# Patient Record
Sex: Female | Born: 1980 | Race: White | Hispanic: No | Marital: Married | Smoking: Never smoker
Health system: Southern US, Community
[De-identification: ages and names within clinical notes are randomized; demographics above are authoritative.]

## PROBLEM LIST (undated history)

## (undated) DIAGNOSIS — F329 Major depressive disorder, single episode, unspecified: Secondary | ICD-10-CM

## (undated) DIAGNOSIS — K219 Gastro-esophageal reflux disease without esophagitis: Secondary | ICD-10-CM

## (undated) DIAGNOSIS — F32A Depression, unspecified: Secondary | ICD-10-CM

## (undated) HISTORY — DX: Depression, unspecified: F32.A

## (undated) HISTORY — PX: TONSILLECTOMY: SUR1361

## (undated) HISTORY — PX: DILATION AND CURETTAGE OF UTERUS: SHX78

## (undated) HISTORY — DX: Major depressive disorder, single episode, unspecified: F32.9

---

## 2002-10-01 ENCOUNTER — Emergency Department (HOSPITAL_COMMUNITY): Admission: EM | Admit: 2002-10-01 | Discharge: 2002-10-01 | Payer: Self-pay | Admitting: Emergency Medicine

## 2002-10-01 ENCOUNTER — Encounter: Payer: Self-pay | Admitting: Emergency Medicine

## 2003-05-10 ENCOUNTER — Encounter: Admission: RE | Admit: 2003-05-10 | Discharge: 2003-08-08 | Payer: Self-pay | Admitting: *Deleted

## 2003-11-17 ENCOUNTER — Other Ambulatory Visit: Admission: RE | Admit: 2003-11-17 | Discharge: 2003-11-17 | Payer: Self-pay | Admitting: Family Medicine

## 2004-12-12 ENCOUNTER — Other Ambulatory Visit: Admission: RE | Admit: 2004-12-12 | Discharge: 2004-12-12 | Payer: Self-pay | Admitting: Family Medicine

## 2006-01-08 ENCOUNTER — Other Ambulatory Visit: Admission: RE | Admit: 2006-01-08 | Discharge: 2006-01-08 | Payer: Self-pay | Admitting: Family Medicine

## 2012-05-20 ENCOUNTER — Encounter (HOSPITAL_COMMUNITY): Payer: Self-pay

## 2012-05-20 ENCOUNTER — Inpatient Hospital Stay (HOSPITAL_COMMUNITY)
Admission: AD | Admit: 2012-05-20 | Discharge: 2012-05-20 | Disposition: A | Discharge status: Home / Self Care | Payer: BC Managed Care – PPO | Source: Ambulatory Visit | Attending: Family Medicine | Admitting: Family Medicine

## 2012-05-20 DIAGNOSIS — N398 Other specified disorders of urinary system: Secondary | ICD-10-CM | POA: Insufficient documentation

## 2012-05-20 DIAGNOSIS — N368 Other specified disorders of urethra: Secondary | ICD-10-CM

## 2012-05-20 NOTE — MAU Provider Note (Signed)
  History     CSN: [id number]  Arrival date and time: 05/20/12 1348   First Provider Initiated Contact with Patient 05/20/12 1415      No chief complaint on file.  HPI Katherine Giles 31 y.o. Sent from [facility] health center for a bartholin's cyst needing drainage.   OB History    Grav Para Term Preterm Abortions TAB SAB Ect Mult Living   1    1 1           History reviewed. No pertinent past medical history.  Past Surgical History  Procedure Date  . Dilation and curettage of uterus     elective abortion  . Tonsillectomy     History reviewed. No pertinent family history.  History  Substance Use Topics  . Smoking status: Never Smoker   . Smokeless tobacco: Not on file  . Alcohol Use: No    Allergies:  Allergies  Allergen Reactions  . Atropine Nausea And Vomiting    Childhood reaction  . Demerol (Meperidine) Nausea And Vomiting    Childhood reaction  . Keflex (Cephalexin) Hives    Prescriptions prior to admission  Medication Sig Dispense Refill  . calcium carbonate (TUMS - DOSED IN MG ELEMENTAL CALCIUM) 500 MG chewable tablet Chew 1 tablet by mouth daily as needed. For heartburn      . sertraline (ZOLOFT) 50 MG tablet Take 50 mg by mouth daily.        Review of Systems  Constitutional: Negative for fever.  Gastrointestinal: Negative for nausea, vomiting, abdominal pain, diarrhea and constipation.  Genitourinary:       Swollen area on vulva   Physical Exam   Blood pressure 128/78, pulse 91, temperature 98.5 F (36.9 C), temperature source Oral, resp. rate 18, height 5\' 5"  (1.651 m), weight 118.48 kg (261 lb 3.2 oz), last menstrual period 04/19/2012.  Physical Exam  Nursing note and vitals reviewed. Constitutional: She is oriented to person, place, and time. She appears well-developed and well-nourished.  HENT:  Head: Normocephalic.  Eyes: EOM are normal.  Neck: Neck supple.  Genitourinary:       Swollen area on right near the clitoris 1 cm x 1.5 cm,  nontender to palpation, no bruising, no "pointing", no draining  Musculoskeletal: Normal range of motion.  Neurological: She is alert and oriented to person, place, and time.  Skin: Skin is warm and dry.  Psychiatric: She has a normal mood and affect.    MAU Course  Procedures  MDM Nontender - no antibiotics needed at this time.  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 Return to MAU if area worsens. Message sent to the GYN clinic to schedule an appointment in the next 2 weeks if possible for reevaluation.  Katherine Giles 05/20/2012, 2:21 PM

## 2012-05-20 NOTE — MAU Note (Signed)
Patient is in with c/o possible bartholin's cyst. She describes it as a pinto bean size cyst on left labia. She noticed the discomfort 2 days ago and states that it now hurts when she moves. No drainage from that spot.

## 2012-05-22 NOTE — MAU Provider Note (Signed)
Chart reviewed and agree with management and plan.  

## 2012-06-08 ENCOUNTER — Encounter: Payer: Self-pay | Admitting: Family Medicine

## 2012-06-08 ENCOUNTER — Ambulatory Visit (INDEPENDENT_AMBULATORY_CARE_PROVIDER_SITE_OTHER): Payer: BC Managed Care – PPO | Admitting: Family Medicine

## 2012-06-08 VITALS — BP 131/94 | HR 91 | Temp 97.3°F | Ht 65.0 in | Wt 259.4 lb

## 2012-06-08 DIAGNOSIS — L723 Sebaceous cyst: Secondary | ICD-10-CM | POA: Insufficient documentation

## 2012-06-08 DIAGNOSIS — N342 Other urethritis: Secondary | ICD-10-CM

## 2012-06-08 NOTE — Progress Notes (Signed)
  Subjective:    Patient ID: Katherine Giles, female    DOB: [birth year], 31 y.o.   MRN: [id number]  HPI  Pt. Is here for f/u.  Seen in MAU for swollen ? Skene's gland.  She reports it went down after hot tub soaking for some time.  Never drained anything.  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  She has regular cycles.  Had last pap at [facility] with insufficient cells last year.  No h/o abnl pap.  Past Medical History  Diagnosis Date  . Depression    Past Surgical History  Procedure Date  . Dilation and curettage of uterus     elective abortion  . Tonsillectomy    Current Outpatient Prescriptions on File Prior to Visit  Medication Sig Dispense Refill  . sertraline (ZOLOFT) 50 MG tablet Take 50 mg by mouth daily.      . calcium carbonate (TUMS - DOSED IN MG ELEMENTAL CALCIUM) 500 MG chewable tablet Chew 1 tablet by mouth daily as needed. For heartburn       Allergies  Allergen Reactions  . Atropine Nausea And Vomiting    Childhood reaction  . Demerol (Meperidine) Nausea And Vomiting    Childhood reaction  . Keflex (Cephalexin) Hives   Family History  Problem Relation Age of Onset  . Heart disease Father   . Cancer Paternal Aunt     cervical  . Cancer Paternal Aunt     ovarian   History   Social History  . Marital Status: Single    Spouse Name: N/A    Number of Children: N/A  . Years of Education: N/A   Occupational History  . Not on file.   Social History Main Topics  . Smoking status: Never Smoker   . Smokeless tobacco: Never Used  . Alcohol Use: 1.2 oz/week    2 Glasses of wine per week  . Drug Use: No  . Sexually Active: Yes    Birth Control/ Protection: None   Other Topics Concern  . Not on file   Social History Narrative  . No narrative on file     Review of Systems  Constitutional: Negative for fever and chills.  HENT: Negative for nosebleeds, congestion and rhinorrhea.   Eyes: Negative for visual disturbance.  Respiratory:  Negative for chest tightness and shortness of breath.   Cardiovascular: Negative for chest pain.  Gastrointestinal: Negative for nausea, vomiting, abdominal pain, diarrhea, constipation, blood in stool and abdominal distention.  Genitourinary: Negative for dysuria, frequency, vaginal bleeding and menstrual problem.  Musculoskeletal: Negative for arthralgias.  Skin: Negative for rash.  Neurological: Negative for dizziness and headaches.  Psychiatric/Behavioral: Negative for behavioral problems, sleep disturbance and dysphoric mood.  All other systems reviewed and are negative.       Objective:   Physical Exam  Vitals reviewed. Constitutional: She appears well-developed and well-nourished.  HENT:  Head: Normocephalic and atraumatic.  Neck: Neck supple.  Cardiovascular: Normal rate and regular rhythm.   Pulmonary/Chest: Effort normal.  Abdominal: Soft. There is no tenderness.  Genitourinary:    There is lesion on the right labia.          Assessment & Plan:  Likley sebaceous cyst.  Not in right area for skene's gland. F/u in spring for pap testing.

## 2012-06-08 NOTE — Assessment & Plan Note (Signed)
Just watch for now-there is no evidence of inflammation.  Discussed full treatment might require excision.

## 2012-06-08 NOTE — Patient Instructions (Addendum)
To follow up for your pap--Please see me at [facility] for [facility] at [facility] at [phone].  Epidermal Cyst An epidermal cyst is sometimes called a sebaceous cyst, epidermal inclusion cyst, or infundibular cyst. These cysts usually contain a substance that looks "pasty" or "cheesy" and may have a bad smell. This substance is a protein called keratin. Epidermal cysts are usually found on the face, neck, or trunk. They may also occur in the vaginal area or other parts of the genitalia of both men and women. Epidermal cysts are usually small, painless, slow-growing bumps or lumps that move freely under the skin. It is important not to try to pop them. This may cause an infection and lead to tenderness and swelling. CAUSES  Epidermal cysts may be caused by a deep penetrating injury to the skin or a plugged hair follicle, often associated with acne. SYMPTOMS  Epidermal cysts can become inflamed and cause:  Redness.  Tenderness.  Increased temperature of the skin over the bumps or lumps.  Grayish-white, bad smelling material that drains from the bump or lump. DIAGNOSIS  Epidermal cysts are easily diagnosed by your caregiver during an exam. Rarely, a tissue sample (biopsy) may be taken to rule out other conditions that may resemble epidermal cysts. TREATMENT   Epidermal cysts often get better and disappear on their own. They are rarely ever cancerous.  If a cyst becomes infected, it may become inflamed and tender. This may require opening and draining the cyst. Treatment with antibiotics may be necessary. When the infection is gone, the cyst may be removed with minor surgery.  Small, inflamed cysts can often be treated with antibiotics or by injecting steroid medicines.  Sometimes, epidermal cysts become large and bothersome. If this happens, surgical removal in your caregiver's office may be necessary. HOME CARE INSTRUCTIONS  Only take over-the-counter or prescription  medicines as directed by your caregiver.  Take your antibiotics as directed. Finish them even if you start to feel better. SEEK MEDICAL CARE IF:   Your cyst becomes tender, red, or swollen.  Your condition is not improving or is getting worse.  You have any other questions or concerns. MAKE SURE YOU:  Understand these instructions.  Will watch your condition.  Will get help right away if you are not doing well or get worse. Document Released: 05/11/2004 Document Revised: 09/02/2011 Document Reviewed: 12/17/2010 [facility] Patient Information 2013 [place], [place].

## 2013-04-01 ENCOUNTER — Other Ambulatory Visit (HOSPITAL_COMMUNITY)
Admission: RE | Admit: 2013-04-01 | Discharge: 2013-04-01 | Disposition: A | Discharge status: Home / Self Care | Payer: BC Managed Care – PPO | Source: Ambulatory Visit | Attending: Internal Medicine | Admitting: Internal Medicine

## 2013-04-01 DIAGNOSIS — Z01419 Encounter for gynecological examination (general) (routine) without abnormal findings: Secondary | ICD-10-CM | POA: Insufficient documentation

## 2014-04-25 ENCOUNTER — Encounter: Payer: Self-pay | Admitting: Family Medicine

## 2016-04-25 ENCOUNTER — Other Ambulatory Visit: Payer: Self-pay | Admitting: Internal Medicine

## 2016-04-25 DIAGNOSIS — N6310 Unspecified lump in the right breast, unspecified quadrant: Secondary | ICD-10-CM

## 2016-04-29 ENCOUNTER — Ambulatory Visit
Admission: RE | Admit: 2016-04-29 | Discharge: 2016-04-29 | Disposition: A | Discharge status: Home / Self Care | Payer: BC Managed Care – PPO | Source: Ambulatory Visit | Attending: Internal Medicine | Admitting: Internal Medicine

## 2016-04-29 DIAGNOSIS — N6310 Unspecified lump in the right breast, unspecified quadrant: Secondary | ICD-10-CM

## 2016-05-14 DIAGNOSIS — N946 Dysmenorrhea, unspecified: Secondary | ICD-10-CM | POA: Insufficient documentation

## 2016-05-14 DIAGNOSIS — N920 Excessive and frequent menstruation with regular cycle: Secondary | ICD-10-CM | POA: Insufficient documentation

## 2017-02-28 ENCOUNTER — Other Ambulatory Visit: Payer: Self-pay | Admitting: Internal Medicine

## 2017-02-28 DIAGNOSIS — N63 Unspecified lump in unspecified breast: Secondary | ICD-10-CM

## 2017-05-02 ENCOUNTER — Ambulatory Visit: Payer: BC Managed Care – PPO

## 2017-05-02 ENCOUNTER — Ambulatory Visit
Admission: RE | Admit: 2017-05-02 | Discharge: 2017-05-02 | Disposition: A | Discharge status: Home / Self Care | Payer: BC Managed Care – PPO | Source: Ambulatory Visit | Attending: Internal Medicine | Admitting: Internal Medicine

## 2017-05-02 DIAGNOSIS — N63 Unspecified lump in unspecified breast: Secondary | ICD-10-CM

## 2019-11-17 ENCOUNTER — Other Ambulatory Visit: Payer: Self-pay | Admitting: Internal Medicine

## 2019-11-17 DIAGNOSIS — N6489 Other specified disorders of breast: Secondary | ICD-10-CM

## 2019-11-17 DIAGNOSIS — Z1231 Encounter for screening mammogram for malignant neoplasm of breast: Secondary | ICD-10-CM

## 2019-12-03 ENCOUNTER — Ambulatory Visit
Admission: RE | Admit: 2019-12-03 | Discharge: 2019-12-03 | Disposition: A | Discharge status: Home / Self Care | Payer: BC Managed Care – PPO | Source: Ambulatory Visit | Attending: Internal Medicine | Admitting: Internal Medicine

## 2019-12-03 ENCOUNTER — Other Ambulatory Visit: Payer: Self-pay | Admitting: Internal Medicine

## 2019-12-03 ENCOUNTER — Other Ambulatory Visit: Payer: Self-pay

## 2019-12-03 DIAGNOSIS — Z1231 Encounter for screening mammogram for malignant neoplasm of breast: Secondary | ICD-10-CM

## 2019-12-03 DIAGNOSIS — N6489 Other specified disorders of breast: Secondary | ICD-10-CM

## 2019-12-03 DIAGNOSIS — R599 Enlarged lymph nodes, unspecified: Secondary | ICD-10-CM

## 2019-12-10 ENCOUNTER — Ambulatory Visit
Admission: RE | Admit: 2019-12-10 | Discharge: 2019-12-10 | Disposition: A | Discharge status: Home / Self Care | Payer: BC Managed Care – PPO | Source: Ambulatory Visit | Attending: Internal Medicine | Admitting: Internal Medicine

## 2019-12-10 ENCOUNTER — Other Ambulatory Visit: Payer: Self-pay | Admitting: Internal Medicine

## 2019-12-10 ENCOUNTER — Other Ambulatory Visit: Payer: Self-pay

## 2019-12-10 DIAGNOSIS — R599 Enlarged lymph nodes, unspecified: Secondary | ICD-10-CM

## 2019-12-14 LAB — SURGICAL PATHOLOGY

## 2019-12-24 ENCOUNTER — Ambulatory Visit: Payer: Self-pay | Admitting: Surgery

## 2019-12-24 DIAGNOSIS — R590 Localized enlarged lymph nodes: Secondary | ICD-10-CM

## 2019-12-24 NOTE — H&P (Signed)
History of Present Illness Katherine Giles. Bettey Muraoka MD; 12/24/2019 12:47 PM) The patient is a 39 year old female who presents with lymphadenopathy. Referred by Dr. Shelia Media for axillary lymphadenopathy.  This is a 39 year old female who was previously followed for some asymmetry of the right breast on mammogram who recently underwent follow-up mammogram. This showed that the area of asymmetry in the upper outer right breast is stable since 2017. However the right axilla showed some enlarged lymph nodes. Ultrasound showed bilateral enlarged axillary lymph node with a 1.2 cm cortical thickness. Subsequently she underwent biopsy which revealed reactive lymphoid hyperplasia with patchy fibrosis of the right axillary lymph node. This was felt to be discordant. She presents now to discuss excisional lymph node biopsy.  No previous breast problems. No family history of breast cancer. Patient denies any systemic symptoms of unexplained weight loss, night sweats, or easy bruising.  CLINICAL DATA: Patient initially presented with an area of palpable concern in the upper outer right breast, and was subsequently found have an area of asymmetry on 04/29/2016. This was followed with repeat imaging on 05/02/2017. This is her first follow-up exam since that study. She complains of intermittent pain in the upper outer right breast.  EXAM: DIGITAL DIAGNOSTIC BILATERAL MAMMOGRAM WITH CAD AND TOMO  ULTRASOUND BILATERAL AXILLA  COMPARISON: Previous exam(s).  ACR Breast Density Category b: There are scattered areas of fibroglandular density.  FINDINGS: The area of asymmetry in the upper outer right breast is stable and has been stable since 04/29/2016. There are no breast masses, new areas of asymmetry, areas of architectural distortion or suspicious calcifications.  In the right axilla, there is a partly seen apparent enlarged lymph node.  Mammographic images were processed with CAD.  Targeted axillary  ultrasound is performed, showing bilateral enlarged lymph nodes with thickened cortices. In the right, there is an enlarged lymph node with little hilar fat, 1.2 cm cortical thickness. There are several additional abnormal lymph nodes in each axilla with abnormally thickened cortices.  IMPRESSION: 1. Bilateral axillary adenopathy of unclear etiology. Recommend tissue sampling 1 of these lymph nodes. 2. Stable benign area of asymmetric fibroglandular tissue in the upper outer right breast.  RECOMMENDATION: 1. Ultrasound-guided core needle biopsy the largest right axillary lymph node, with samples obtained in formalin and saline. The patient was scheduled for this procedure prior to leaving the breast Center.  I have discussed the findings and recommendations with the patient. If applicable, a reminder letter will be sent to the patient regarding the next appointment.  BI-RADS CATEGORY 4: Suspicious.   Electronically Signed By: Lajean Manes M.D. On: 12/03/2019 09:53  CLINICAL DATA: 39 year old female with bilateral axillary lymphadenopathy.  EXAM: [place] AXILLARY NODE CORE BIOPSY RIGHT  COMPARISON: Previous exam(s).  PROCEDURE: I met with the patient and we discussed the procedure of ultrasound-guided biopsy, including benefits and alternatives. We discussed the high likelihood of a successful procedure. We discussed the risks of the procedure, including infection, bleeding, tissue injury, clip migration, and inadequate sampling. Informed written consent was given. The usual time-out protocol was performed immediately prior to the procedure.  Using sterile technique and 1% Lidocaine as local anesthetic, under direct ultrasound visualization, a 14 gauge spring-loaded device was used to perform biopsy of a right axillary lymph node using a lateral approach. At the conclusion of the procedure [facility] tissue marker clip was deployed into the biopsy cavity. The  sample was sent in both formalin and saline. Follow up 2 view mammogram was performed and dictated separately.  IMPRESSION: Ultrasound guided biopsy of a right axillary lymph node. No apparent complications.  Electronically Signed: By: Kristopher Oppenheim M.D. On: 12/10/2019 15:33    Problem List/Past Medical Rodman Key K. Tocara Mennen, MD; 12/24/2019 12:47 PM) Kerin Ransom LYMPHADENOPATHY (R59.0)  Past Surgical History Darden Palmer, [place]; 12/24/2019 9:52 AM) Breast Biopsy Right. Oral Surgery Tonsillectomy  Diagnostic Studies History Darden Palmer, [place]; 12/24/2019 9:52 AM) Colonoscopy never Mammogram within last year Pap Smear 1-5 years ago  Allergies Darden Palmer, RMA; 12/24/2019 9:53 AM) Demerol *ANALGESICS - OPIOID* Keflex *CEPHALOSPORINS*  Medication History Darden Palmer, RMA; 12/24/2019 9:54 AM) Sertraline HCl ('100MG'$  Tablet, Oral) Active. LORazepam (0.'5MG'$  Tablet, Oral) Active. Multivitamin (Oral) Active.  Social History Darden Palmer, [place]; 12/24/2019 9:52 AM) Alcohol use Occasional alcohol use. Caffeine use Carbonated beverages. No drug use Tobacco use Never smoker.  Family History Darden Palmer, [place]; 12/24/2019 9:52 AM) Depression Father, Mother. Diabetes Mellitus Father. Heart Disease Father. Heart disease in female family member before age 9 Hypertension Father.  Pregnancy / Birth History Darden Palmer, [place]; 12/24/2019 9:52 AM) Age at menarche 48 years. Contraceptive History Oral contraceptives. Gravida 0 Para 0 Regular periods  Other Problems Rodman Key K. Niquan Charnley, MD; 12/24/2019 12:47 PM) Anxiety Disorder Back Pain Depression Gastroesophageal Reflux Disease     Review of Systems Darden Palmer RMA; 12/24/2019 9:52 AM) General Present- Appetite Loss, Fatigue and Night Sweats. Not Present- Chills, Fever, Weight Gain and Weight Loss. Skin Not Present- Change in Wart/Mole, Dryness, Hives, Jaundice, New Lesions, Non-Healing Wounds, Rash and  Ulcer. HEENT Present- Seasonal Allergies. Not Present- Earache, Hearing Loss, Hoarseness, Nose Bleed, Oral Ulcers, Ringing in the Ears, Sinus Pain, Sore Throat, Visual Disturbances, Wears glasses/contact lenses and Yellow Eyes. Respiratory Present- Snoring. Not Present- Bloody sputum, Chronic Cough, Difficulty Breathing and Wheezing. Breast Not Present- Breast Mass, Breast Pain, Nipple Discharge and Skin Changes. Cardiovascular Not Present- Chest Pain, Difficulty Breathing Lying Down, Leg Cramps, Palpitations, Rapid Heart Rate, Shortness of Breath and Swelling of Extremities. Gastrointestinal Present- Excessive gas and Indigestion. Not Present- Abdominal Pain, Bloating, Bloody Stool, Change in Bowel Habits, Chronic diarrhea, Constipation, Difficulty Swallowing, Gets full quickly at meals, Hemorrhoids, Nausea, Rectal Pain and Vomiting. Female Genitourinary Not Present- Frequency, Nocturia, Painful Urination, Pelvic Pain and Urgency. Musculoskeletal Present- Back Pain. Not Present- Joint Pain, Joint Stiffness, Muscle Pain, Muscle Weakness and Swelling of Extremities. Neurological Not Present- Decreased Memory, Fainting, Headaches, Numbness, Seizures, Tingling, Tremor, Trouble walking and Weakness. Psychiatric Present- Anxiety. Not Present- Bipolar, Change in Sleep Pattern, Depression, Fearful and Frequent crying. Endocrine Not Present- Cold Intolerance, Excessive Hunger, Hair Changes, Heat Intolerance, Hot flashes and New Diabetes. Hematology Not Present- Blood Thinners, Easy Bruising, Excessive bleeding, Gland problems, HIV and Persistent Infections.  Vitals Lattie Haw [place] RMA; 12/24/2019 9:55 AM) 12/24/2019 9:55 AM Weight: 251.38 lb Height: 64in Body Surface Area: 2.16 m Body Mass Index: 43.15 kg/m  Temp.: 43F  Pulse: 77 (Regular)  BP: 130/84(Sitting, Left Arm, Standard)        Physical Exam Rodman Key K. Eudelia Hiltunen MD; 12/24/2019 12:47 PM)  The physical exam findings are as  follows: Note:Constitutional: WDWN in NAD, conversant, no obvious deformities; resting comfortably Eyes: Pupils equal, round; sclera anicteric; moist conjunctiva; no lid lag HENT: Oral mucosa moist; good dentition Neck: No masses palpated, trachea midline; no thyromegaly Breasts: symmetric; no dominant masses; no nipple changes; No palpable lymphadenopathy on either side Lungs: CTA bilaterally; normal respiratory effort CV: Regular rate and rhythm; no murmurs; extremities well-perfused with no edema Abd: +bowel sounds, soft, non-tender, no palpable organomegaly; no palpable  hernias Musc: Normal gait; no apparent clubbing or cyanosis in extremities Lymphatic: No palpable cervical or axillary lymphadenopathy Skin: Warm, dry; no sign of jaundice Psychiatric - alert and oriented x 4; calm mood and affect    Assessment & Plan Rodman Key K. Gwendoline Judy MD; 12/24/2019 12:48 PM)  AXILLARY LYMPHADENOPATHY (R59.0) Impression: Bilateral  Current Plans Schedule for Surgery - Right targeted axillary lymph node biopsy. The surgical procedure has been discussed with the patient. Potential risks, benefits, alternative treatments, and expected outcomes have been explained. All of the patient's questions at this time have been answered. The likelihood of reaching the patient's treatment goal is good. The patient understand the proposed surgical procedure and wishes to proceed. Note:Recommend targeted right axillary lymph node biopsy to establish diagnosis. The patient has a lot of questions but most of these cannot be inserted until the lymph node biopsy is performed. She is in agreement with the plan for lymph node biopsy with complete excision of the lymph node using radioactive seed guidance.  Katherine Giles. Georgette Dover, MD, [facility]  General/ Trauma Surgery   12/24/2019 12:49 PM

## 2020-01-07 ENCOUNTER — Other Ambulatory Visit: Payer: Self-pay | Admitting: Surgery

## 2020-01-07 DIAGNOSIS — R590 Localized enlarged lymph nodes: Secondary | ICD-10-CM

## 2020-01-10 ENCOUNTER — Ambulatory Visit: Payer: Self-pay | Admitting: Surgery

## 2020-01-10 DIAGNOSIS — R590 Localized enlarged lymph nodes: Secondary | ICD-10-CM

## 2020-01-31 ENCOUNTER — Other Ambulatory Visit: Payer: Self-pay

## 2020-01-31 ENCOUNTER — Encounter (HOSPITAL_BASED_OUTPATIENT_CLINIC_OR_DEPARTMENT_OTHER): Payer: Self-pay | Admitting: Surgery

## 2020-02-05 ENCOUNTER — Other Ambulatory Visit (HOSPITAL_COMMUNITY): Payer: BC Managed Care – PPO

## 2020-02-07 ENCOUNTER — Other Ambulatory Visit (HOSPITAL_COMMUNITY)
Admission: RE | Admit: 2020-02-07 | Discharge: 2020-02-07 | Disposition: A | Discharge status: Home / Self Care | Payer: BC Managed Care – PPO | Source: Ambulatory Visit | Attending: Surgery | Admitting: Surgery

## 2020-02-07 DIAGNOSIS — Z20822 Contact with and (suspected) exposure to covid-19: Secondary | ICD-10-CM | POA: Diagnosis not present

## 2020-02-07 DIAGNOSIS — Z01812 Encounter for preprocedural laboratory examination: Secondary | ICD-10-CM | POA: Insufficient documentation

## 2020-02-07 LAB — SARS CORONAVIRUS 2 (TAT 6-24 HRS): SARS Coronavirus 2: NEGATIVE

## 2020-02-08 ENCOUNTER — Ambulatory Visit
Admission: RE | Admit: 2020-02-08 | Discharge: 2020-02-08 | Disposition: A | Discharge status: Home / Self Care | Payer: BC Managed Care – PPO | Source: Ambulatory Visit | Attending: Surgery | Admitting: Surgery

## 2020-02-08 ENCOUNTER — Other Ambulatory Visit: Payer: Self-pay | Admitting: Surgery

## 2020-02-08 ENCOUNTER — Other Ambulatory Visit: Payer: Self-pay

## 2020-02-08 DIAGNOSIS — R590 Localized enlarged lymph nodes: Secondary | ICD-10-CM

## 2020-02-08 NOTE — Progress Notes (Signed)

## 2020-02-09 ENCOUNTER — Encounter (HOSPITAL_BASED_OUTPATIENT_CLINIC_OR_DEPARTMENT_OTHER)
Admission: RE | Disposition: A | Discharge status: Home / Self Care | Payer: Self-pay | Source: Home / Self Care | Attending: Surgery

## 2020-02-09 ENCOUNTER — Ambulatory Visit (HOSPITAL_BASED_OUTPATIENT_CLINIC_OR_DEPARTMENT_OTHER)
Admission: RE | Admit: 2020-02-09 | Discharge: 2020-02-09 | Disposition: A | Discharge status: Home / Self Care | Payer: BC Managed Care – PPO | Attending: Surgery | Admitting: Surgery

## 2020-02-09 ENCOUNTER — Ambulatory Visit (HOSPITAL_BASED_OUTPATIENT_CLINIC_OR_DEPARTMENT_OTHER): Payer: BC Managed Care – PPO | Admitting: Anesthesiology

## 2020-02-09 ENCOUNTER — Encounter (HOSPITAL_BASED_OUTPATIENT_CLINIC_OR_DEPARTMENT_OTHER): Payer: Self-pay | Admitting: Surgery

## 2020-02-09 ENCOUNTER — Other Ambulatory Visit: Payer: Self-pay

## 2020-02-09 ENCOUNTER — Ambulatory Visit
Admission: RE | Admit: 2020-02-09 | Discharge: 2020-02-09 | Disposition: A | Discharge status: Home / Self Care | Payer: BC Managed Care – PPO | Source: Ambulatory Visit | Attending: Surgery | Admitting: Surgery

## 2020-02-09 DIAGNOSIS — Z6841 Body Mass Index (BMI) 40.0 and over, adult: Secondary | ICD-10-CM | POA: Diagnosis not present

## 2020-02-09 DIAGNOSIS — R590 Localized enlarged lymph nodes: Secondary | ICD-10-CM | POA: Diagnosis present

## 2020-02-09 DIAGNOSIS — Z79899 Other long term (current) drug therapy: Secondary | ICD-10-CM | POA: Diagnosis not present

## 2020-02-09 DIAGNOSIS — F329 Major depressive disorder, single episode, unspecified: Secondary | ICD-10-CM | POA: Diagnosis not present

## 2020-02-09 DIAGNOSIS — F419 Anxiety disorder, unspecified: Secondary | ICD-10-CM | POA: Insufficient documentation

## 2020-02-09 HISTORY — PX: AXILLARY LYMPH NODE BIOPSY: SHX5737

## 2020-02-09 HISTORY — DX: Gastro-esophageal reflux disease without esophagitis: K21.9

## 2020-02-09 LAB — POCT PREGNANCY, URINE: Preg Test, Ur: NEGATIVE

## 2020-02-09 SURGERY — AXILLARY LYMPH NODE BIOPSY
Anesthesia: Regional | Site: Breast | Laterality: Right

## 2020-02-09 MED ORDER — CHLORHEXIDINE GLUCONATE CLOTH 2 % EX PADS: 6.0000 | MEDICATED_PAD | Freq: Once | CUTANEOUS | Status: DC

## 2020-02-09 MED ORDER — LACTATED RINGERS IV SOLN: INTRAVENOUS | Status: DC

## 2020-02-09 MED ORDER — PROPOFOL 10 MG/ML IV BOLUS
INTRAVENOUS | Status: DC | PRN
  Administered 2020-02-09: 200 mg via INTRAVENOUS
  Administered 2020-02-09: 50 mg via INTRAVENOUS

## 2020-02-09 MED ORDER — MIDAZOLAM HCL 2 MG/2ML IJ SOLN
2.0000 mg | Freq: Once | INTRAMUSCULAR | Status: AC
  Administered 2020-02-09: 2 mg via INTRAVENOUS

## 2020-02-09 MED ORDER — HYDROCODONE-ACETAMINOPHEN 5-325 MG PO TABS
1.0000 | ORAL_TABLET | Freq: Four times a day (QID) | ORAL | 0 refills | Status: DC | PRN
Start: 2020-02-09 — End: 2020-04-03

## 2020-02-09 MED ORDER — BUPIVACAINE LIPOSOME 1.3 % IJ SUSP
INTRAMUSCULAR | Status: DC | PRN
  Administered 2020-02-09: 10 mL

## 2020-02-09 MED ORDER — PROPOFOL 10 MG/ML IV BOLUS
INTRAVENOUS | Status: AC
  Filled 2020-02-09: qty 20

## 2020-02-09 MED ORDER — OXYCODONE HCL 5 MG/5ML PO SOLN: 5.0000 mg | Freq: Once | ORAL | Status: AC | PRN

## 2020-02-09 MED ORDER — OXYCODONE HCL 5 MG PO TABS
ORAL_TABLET | ORAL | Status: AC
  Filled 2020-02-09: qty 1

## 2020-02-09 MED ORDER — FENTANYL CITRATE (PF) 100 MCG/2ML IJ SOLN
INTRAMUSCULAR | Status: AC
  Filled 2020-02-09: qty 2

## 2020-02-09 MED ORDER — FENTANYL CITRATE (PF) 100 MCG/2ML IJ SOLN
100.0000 ug | Freq: Once | INTRAMUSCULAR | Status: AC
  Administered 2020-02-09: 100 ug via INTRAVENOUS

## 2020-02-09 MED ORDER — ACETAMINOPHEN 500 MG PO TABS
1000.0000 mg | ORAL_TABLET | ORAL | Status: AC
  Administered 2020-02-09: 1000 mg via ORAL

## 2020-02-09 MED ORDER — HYDROMORPHONE HCL 1 MG/ML IJ SOLN: 0.2500 mg | INTRAMUSCULAR | Status: DC | PRN

## 2020-02-09 MED ORDER — GABAPENTIN 300 MG PO CAPS
ORAL_CAPSULE | ORAL | Status: AC
  Filled 2020-02-09: qty 1

## 2020-02-09 MED ORDER — BUPIVACAINE HCL 0.25 % IJ SOLN
INTRAMUSCULAR | Status: DC | PRN
  Administered 2020-02-09: 10 mL

## 2020-02-09 MED ORDER — OXYCODONE HCL 5 MG PO TABS
5.0000 mg | ORAL_TABLET | Freq: Once | ORAL | Status: AC | PRN
  Administered 2020-02-09: 5 mg via ORAL

## 2020-02-09 MED ORDER — ACETAMINOPHEN 500 MG PO TABS
ORAL_TABLET | ORAL | Status: AC
  Filled 2020-02-09: qty 2

## 2020-02-09 MED ORDER — PROMETHAZINE HCL 25 MG/ML IJ SOLN: 6.2500 mg | INTRAMUSCULAR | Status: DC | PRN

## 2020-02-09 MED ORDER — VANCOMYCIN HCL IN DEXTROSE 1-5 GM/200ML-% IV SOLN
INTRAVENOUS | Status: AC
  Filled 2020-02-09: qty 200

## 2020-02-09 MED ORDER — MIDAZOLAM HCL 2 MG/2ML IJ SOLN
INTRAMUSCULAR | Status: AC
  Filled 2020-02-09: qty 2

## 2020-02-09 MED ORDER — BUPIVACAINE HCL 0.5 % IJ SOLN
INTRAMUSCULAR | Status: DC | PRN
  Administered 2020-02-09: 20 mL

## 2020-02-09 MED ORDER — DEXAMETHASONE SODIUM PHOSPHATE 10 MG/ML IJ SOLN
INTRAMUSCULAR | Status: DC | PRN
  Administered 2020-02-09: 10 mg via INTRAVENOUS

## 2020-02-09 MED ORDER — MIDAZOLAM HCL 5 MG/5ML IJ SOLN
INTRAMUSCULAR | Status: DC | PRN
  Administered 2020-02-09 (×2): 1 mg via INTRAVENOUS

## 2020-02-09 MED ORDER — ONDANSETRON HCL 4 MG/2ML IJ SOLN
INTRAMUSCULAR | Status: AC
  Filled 2020-02-09: qty 2

## 2020-02-09 MED ORDER — VANCOMYCIN HCL IN DEXTROSE 1-5 GM/200ML-% IV SOLN
1000.0000 mg | INTRAVENOUS | Status: AC
  Administered 2020-02-09: 1000 mg via INTRAVENOUS

## 2020-02-09 MED ORDER — GABAPENTIN 300 MG PO CAPS
300.0000 mg | ORAL_CAPSULE | ORAL | Status: AC
  Administered 2020-02-09: 300 mg via ORAL

## 2020-02-09 MED ORDER — LIDOCAINE HCL (CARDIAC) PF 100 MG/5ML IV SOSY
PREFILLED_SYRINGE | INTRAVENOUS | Status: DC | PRN
Start: 2020-02-09 — End: 2020-02-09
  Administered 2020-02-09: 100 mg via INTRATRACHEAL

## 2020-02-09 MED ORDER — ONDANSETRON HCL 4 MG/2ML IJ SOLN
INTRAMUSCULAR | Status: DC | PRN
  Administered 2020-02-09: 4 mg via INTRAVENOUS

## 2020-02-09 MED ORDER — FENTANYL CITRATE (PF) 100 MCG/2ML IJ SOLN
INTRAMUSCULAR | Status: DC | PRN
  Administered 2020-02-09: 50 ug via INTRAVENOUS
  Administered 2020-02-09 (×2): 25 ug via INTRAVENOUS

## 2020-02-09 SURGICAL SUPPLY — 42 items
APL PRP STRL LF DISP 70% ISPRP (MISCELLANEOUS) ×1
APL SKNCLS STERI-STRIP NONHPOA (GAUZE/BANDAGES/DRESSINGS) ×1
APPLIER CLIP 11 MED OPEN (CLIP) ×2
APR CLP MED 11 20 MLT OPN (CLIP) ×1
BENZOIN TINCTURE PRP APPL 2/3 (GAUZE/BANDAGES/DRESSINGS) ×2 IMPLANT
BLADE SURG 15 STRL LF DISP TIS (BLADE) ×1 IMPLANT
BLADE SURG 15 STRL SS (BLADE) ×2
CANISTER SUCT 1200ML W/VALVE (MISCELLANEOUS) ×2 IMPLANT
CHLORAPREP W/TINT 26 (MISCELLANEOUS) ×2 IMPLANT
CLIP APPLIE 11 MED OPEN (CLIP) ×1 IMPLANT
COVER BACK TABLE 60X90IN (DRAPES) ×2 IMPLANT
COVER MAYO STAND STRL (DRAPES) ×2 IMPLANT
DRAPE LAPAROTOMY TRNSV 102X78 (DRAPES) ×2 IMPLANT
DRAPE UTILITY XL STRL (DRAPES) ×2 IMPLANT
DRSG TEGADERM 4X4.75 (GAUZE/BANDAGES/DRESSINGS) ×2 IMPLANT
ELECT REM PT RETURN 9FT ADLT (ELECTROSURGICAL) ×2
ELECTRODE REM PT RTRN 9FT ADLT (ELECTROSURGICAL) ×1 IMPLANT
GAUZE SPONGE 4X4 12PLY STRL LF (GAUZE/BANDAGES/DRESSINGS) ×2 IMPLANT
GLOVE BIO SURGEON STRL SZ 6.5 (GLOVE) ×2 IMPLANT
GLOVE BIO SURGEON STRL SZ7 (GLOVE) ×2 IMPLANT
GLOVE BIOGEL PI IND STRL 6.5 (GLOVE) ×1 IMPLANT
GLOVE BIOGEL PI IND STRL 7.5 (GLOVE) ×1 IMPLANT
GLOVE BIOGEL PI INDICATOR 6.5 (GLOVE) ×1
GLOVE BIOGEL PI INDICATOR 7.5 (GLOVE) ×1
GOWN STRL REUS W/ TWL LRG LVL3 (GOWN DISPOSABLE) ×2 IMPLANT
GOWN STRL REUS W/TWL LRG LVL3 (GOWN DISPOSABLE) ×4
NEEDLE HYPO 25X1 1.5 SAFETY (NEEDLE) ×2 IMPLANT
NS IRRIG 1000ML POUR BTL (IV SOLUTION) ×2 IMPLANT
PACK BASIN DAY SURGERY FS (CUSTOM PROCEDURE TRAY) ×2 IMPLANT
PENCIL SMOKE EVACUATOR (MISCELLANEOUS) ×2 IMPLANT
SLEEVE SCD COMPRESS KNEE MED (MISCELLANEOUS) ×2 IMPLANT
SPONGE LAP 4X18 RFD (DISPOSABLE) ×2 IMPLANT
STRIP CLOSURE SKIN 1/2X4 (GAUZE/BANDAGES/DRESSINGS) ×2 IMPLANT
SUT MON AB 4-0 PC3 18 (SUTURE) ×2 IMPLANT
SUT VIC AB 2-0 SH 27 (SUTURE) ×4
SUT VIC AB 2-0 SH 27XBRD (SUTURE) ×2 IMPLANT
SUT VIC AB 3-0 SH 27 (SUTURE) ×2
SUT VIC AB 3-0 SH 27X BRD (SUTURE) ×1 IMPLANT
SYR CONTROL 10ML LL (SYRINGE) ×2 IMPLANT
TOWEL GREEN STERILE FF (TOWEL DISPOSABLE) ×2 IMPLANT
TUBE CONNECTING 20X1/4 (TUBING) ×2 IMPLANT
YANKAUER SUCT BULB TIP NO VENT (SUCTIONS) ×2 IMPLANT

## 2020-02-09 NOTE — H&P (Signed)
History of Present Illness  The patient is a 39 year old female who presents with lymphadenopathy. Referred by Dr. Shelia Media for axillary lymphadenopathy.  This is a 39 year old female who was previously followed for some asymmetry of the right breast on mammogram who recently underwent follow-up mammogram. This showed that the area of asymmetry in the upper outer right breast is stable since 2017. However the right axilla showed some enlarged lymph nodes. Ultrasound showed bilateral enlarged axillary lymph node with a 1.2 cm cortical thickness. Subsequently she underwent biopsy which revealed reactive lymphoid hyperplasia with patchy fibrosis of the right axillary lymph node. This was felt to be discordant. She presents now to discuss excisional lymph node biopsy.  No previous breast problems. No family history of breast cancer. Patient denies any systemic symptoms of unexplained weight loss, night sweats, or easy bruising.  CLINICAL DATA: Patient initially presented with an area of palpable concern in the upper outer right breast, and was subsequently found have an area of asymmetry on 04/29/2016. This was followed with repeat imaging on 05/02/2017. This is her first follow-up exam since that study. She complains of intermittent pain in the upper outer right breast.  EXAM: DIGITAL DIAGNOSTIC BILATERAL MAMMOGRAM WITH CAD AND TOMO  ULTRASOUND BILATERAL AXILLA  COMPARISON: Previous exam(s).  ACR Breast Density Category b: There are scattered areas of fibroglandular density.  FINDINGS: The area of asymmetry in the upper outer right breast is stable and has been stable since 04/29/2016. There are no breast masses, new areas of asymmetry, areas of architectural distortion or suspicious calcifications.  In the right axilla, there is a partly seen apparent enlarged lymph node.  Mammographic images were processed with CAD.  Targeted axillary ultrasound is performed,  showing bilateral enlarged lymph nodes with thickened cortices. In the right, there is an enlarged lymph node with little hilar fat, 1.2 cm cortical thickness. There are several additional abnormal lymph nodes in each axilla with abnormally thickened cortices.  IMPRESSION: 1. Bilateral axillary adenopathy of unclear etiology. Recommend tissue sampling 1 of these lymph nodes. 2. Stable benign area of asymmetric fibroglandular tissue in the upper outer right breast.  RECOMMENDATION: 1. Ultrasound-guided core needle biopsy the largest right axillary lymph node, with samples obtained in formalin and saline. The patient was scheduled for this procedure prior to leaving the breast Center.  I have discussed the findings and recommendations with the patient. If applicable, a reminder letter will be sent to the patient regarding the next appointment.  BI-RADS CATEGORY 4: Suspicious.   Electronically Signed By: Lajean Manes M.D. On: 12/03/2019 09:53  CLINICAL DATA: 39 year old female with bilateral axillary lymphadenopathy.  EXAM: [place] AXILLARY NODE CORE BIOPSY RIGHT  COMPARISON: Previous exam(s).  PROCEDURE: I met with the patient and we discussed the procedure of ultrasound-guided biopsy, including benefits and alternatives. We discussed the high likelihood of a successful procedure. We discussed the risks of the procedure, including infection, bleeding, tissue injury, clip migration, and inadequate sampling. Informed written consent was given. The usual time-out protocol was performed immediately prior to the procedure.  Using sterile technique and 1% Lidocaine as local anesthetic, under direct ultrasound visualization, a 14 gauge spring-loaded device was used to perform biopsy of a right axillary lymph node using a lateral approach. At the conclusion of the procedure [facility] tissue marker clip was deployed into the biopsy cavity. The sample was sent in  both formalin and saline. Follow up 2 view mammogram was performed and dictated separately.  IMPRESSION: Ultrasound guided biopsy of  a right axillary lymph node. No apparent complications.  Electronically Signed: By: Kristopher Oppenheim M.D. On: 12/10/2019 15:33    Problem List/Past Medical  AXILLARY LYMPHADENOPATHY (R59.0)  Past Surgical History  Breast Biopsy Right. Oral Surgery Tonsillectomy  Diagnostic Studies History  Colonoscopy never Mammogram within last year Pap Smear 1-5 years ago  Allergies  Demerol *ANALGESICS - OPIOID* Keflex *CEPHALOSPORINS*  Medication History  Sertraline HCl (100MG Tablet, Oral) Active. LORazepam (0.5MG Tablet, Oral) Active. Multivitamin (Oral) Active.  Social History Alcohol use Occasional alcohol use. Caffeine use Carbonated beverages. No drug use Tobacco use Never smoker.  Family History  Depression Father, Mother. Diabetes Mellitus Father. Heart Disease Father. Heart disease in female family member before age 37 Hypertension Father.  Pregnancy / Birth History  Age at menarche 13 years. Contraceptive History Oral contraceptives. Gravida 0 Para 0 Regular periods  Other Problems  Anxiety Disorder Back Pain Depression Gastroesophageal Reflux Disease     Review of Systems  General Present- Appetite Loss, Fatigue and Night Sweats. Not Present- Chills, Fever, Weight Gain and Weight Loss. Skin Not Present- Change in Wart/Mole, Dryness, Hives, Jaundice, New Lesions, Non-Healing Wounds, Rash and Ulcer. HEENT Present- Seasonal Allergies. Not Present- Earache, Hearing Loss, Hoarseness, Nose Bleed, Oral Ulcers, Ringing in the Ears, Sinus Pain, Sore Throat, Visual Disturbances, Wears glasses/contact lenses and Yellow Eyes. Respiratory Present- Snoring. Not Present- Bloody sputum, Chronic Cough, Difficulty Breathing and Wheezing. Breast Not Present- Breast Mass, Breast Pain, Nipple  Discharge and Skin Changes. Cardiovascular Not Present- Chest Pain, Difficulty Breathing Lying Down, Leg Cramps, Palpitations, Rapid Heart Rate, Shortness of Breath and Swelling of Extremities. Gastrointestinal Present- Excessive gas and Indigestion. Not Present- Abdominal Pain, Bloating, Bloody Stool, Change in Bowel Habits, Chronic diarrhea, Constipation, Difficulty Swallowing, Gets full quickly at meals, Hemorrhoids, Nausea, Rectal Pain and Vomiting. Female Genitourinary Not Present- Frequency, Nocturia, Painful Urination, Pelvic Pain and Urgency. Musculoskeletal Present- Back Pain. Not Present- Joint Pain, Joint Stiffness, Muscle Pain, Muscle Weakness and Swelling of Extremities. Neurological Not Present- Decreased Memory, Fainting, Headaches, Numbness, Seizures, Tingling, Tremor, Trouble walking and Weakness. Psychiatric Present- Anxiety. Not Present- Bipolar, Change in Sleep Pattern, Depression, Fearful and Frequent crying. Endocrine Not Present- Cold Intolerance, Excessive Hunger, Hair Changes, Heat Intolerance, Hot flashes and New Diabetes. Hematology Not Present- Blood Thinners, Easy Bruising, Excessive bleeding, Gland problems, HIV and Persistent Infections.  Vitals  Weight: 251.38 lb Height: 64in Body Surface Area: 2.16 m Body Mass Index: 43.15 kg/m  Temp.: 83F  Pulse: 77 (Regular)  BP: 130/84(Sitting, Left Arm, Standard)        Physical Exam   The physical exam findings are as follows: Note:Constitutional: WDWN in NAD, conversant, no obvious deformities; resting comfortably Eyes: Pupils equal, round; sclera anicteric; moist conjunctiva; no lid lag HENT: Oral mucosa moist; good dentition Neck: No masses palpated, trachea midline; no thyromegaly Breasts: symmetric; no dominant masses; no nipple changes; No palpable lymphadenopathy on either side Lungs: CTA bilaterally; normal respiratory effort CV: Regular rate and rhythm; no murmurs; extremities  well-perfused with no edema Abd: +bowel sounds, soft, non-tender, no palpable organomegaly; no palpable hernias Musc: Normal gait; no apparent clubbing or cyanosis in extremities Lymphatic: No palpable cervical or axillary lymphadenopathy Skin: Warm, dry; no sign of jaundice Psychiatric - alert and oriented x 4; calm mood and affect    Assessment & Plan   AXILLARY LYMPHADENOPATHY (R59.0) Impression: Bilateral  Current Plans Schedule for Surgery - Right targeted axillary lymph node biopsy. The surgical procedure has been discussed with the patient. Potential  risks, benefits, alternative treatments, and expected outcomes have been explained. All of the patient's questions at this time have been answered. The likelihood of reaching the patient's treatment goal is good. The patient understand the proposed surgical procedure and wishes to proceed. Note:Recommend targeted right axillary lymph node biopsy to establish diagnosis. The patient has a lot of questions but most of these cannot be inserted until the lymph node biopsy is performed. She is in agreement with the plan for lymph node biopsy with complete excision of the lymph node using radioactive seed guidance.   Imogene Burn. Georgette Dover, MD, [facility]  General/ Trauma Surgery   02/09/2020 8:23 AM

## 2020-02-09 NOTE — Anesthesia Postprocedure Evaluation (Signed)
Anesthesia Post Note  Patient: Katherine Giles  Procedure(s) Performed: RIGHT TARGETED AXILLARY LYMPH NODE BIOPSY (Right Breast)     Patient location during evaluation: PACU Anesthesia Type: Regional Level of consciousness: awake and alert Pain management: pain level controlled Vital Signs Assessment: post-procedure vital signs reviewed and stable Respiratory status: spontaneous breathing, nonlabored ventilation and respiratory function stable Cardiovascular status: blood pressure returned to baseline and stable Postop Assessment: no apparent nausea or vomiting Anesthetic complications: no   No complications documented.  Last Vitals:  Vitals:   02/09/20 1134 02/09/20 1156  BP:  127/89  Pulse:  69  Resp:  16  Temp: 36.9 C 36.9 C  SpO2:  98%    Last Pain:  Vitals:   02/09/20 1157  TempSrc:   PainSc: 1                  Candra R Doran Nestle

## 2020-02-09 NOTE — Discharge Instructions (Signed)
[facility] Office Phone Number [phone]   POST OP INSTRUCTIONS  Always review your discharge instruction sheet given to you by the facility where your surgery was performed.  IF YOU HAVE DISABILITY OR FAMILY LEAVE FORMS, YOU MUST BRING THEM TO THE OFFICE FOR PROCESSING.  DO NOT GIVE THEM TO YOUR DOCTOR.  1. A prescription for pain medication may be given to you upon discharge.  Take your pain medication as prescribed, if needed.  If narcotic pain medicine is not needed, then you may take acetaminophen (Tylenol) or ibuprofen (Advil) as needed. 2. Take your usually prescribed medications unless otherwise directed 3. If you need a refill on your pain medication, please contact your pharmacy.  They will contact our office to request authorization.  Prescriptions will not be filled after 5pm or on week-ends. 4. You should eat very light the first 24 hours after surgery, such as soup, crackers, pudding, etc.  Resume your normal diet the day after surgery. 5. Most patients will experience some swelling and bruising in the axilla.  Ice packs will help.  Swelling and bruising can take several days to resolve.  6. It is common to experience some constipation if taking pain medication after surgery.  Increasing fluid intake and taking a stool softener will usually help or prevent this problem from occurring.  A mild laxative (Milk of Magnesia or Miralax) should be taken according to package directions if there are no bowel movements after 48 hours. 7. Unless discharge instructions indicate otherwise, you may remove your bandages 24-48 hours after surgery, and you may shower at that time.  You may have steri-strips (small skin tapes) in place directly over the incision.  These strips should be left on the skin for 7-10 days.  If your surgeon used skin glue on the incision, you may shower in 24 hours.  The glue will flake off over the next 2-3 weeks.  Any sutures or staples will be removed at  the office during your follow-up visit. 8. ACTIVITIES:  You may resume regular daily activities (gradually increasing) beginning the next day.  Wearing a good support bra or sports bra minimizes pain and swelling.  You may have sexual intercourse when it is comfortable. a. You may drive when you no longer are taking prescription pain medication, you can comfortably wear a seatbelt, and you can safely maneuver your car and apply brakes. b. RETURN TO WORK:  ______________________________________________________________________________________ 9. You should see your doctor in the office for a follow-up appointment approximately two weeks after your surgery.  Your doctor's nurse will typically make your follow-up appointment when she calls you with your pathology report.  Expect your pathology report 2-3 business days after your surgery.  You may call to check if you do not hear from [place] after three days. 10. OTHER INSTRUCTIONS: _______________________________________________________________________________________________ _____________________________________________________________________________________________________________________________________ _____________________________________________________________________________________________________________________________________ _____________________________________________________________________________________________________________________________________  WHEN TO CALL YOUR DOCTOR: 1. Fever over 101.0 2. Nausea and/or vomiting. 3. Extreme swelling or bruising. 4. Continued bleeding from incision. 5. Increased pain, redness, or drainage from the incision.  The clinic staff is available to answer your questions during regular business hours.  Please don't hesitate to call and ask to speak to one of the nurses for clinical concerns.  If you have a medical emergency, go to the nearest emergency room or call 911.  A surgeon from [facility] is always on call at the hospital.  For further questions, please visit [email or web address]     No Tylenol before 2:00pm if  needed. No Hydrocodone before 5:30 pm if needed.   Post Anesthesia Home Care Instructions  Activity: Get plenty of rest for the remainder of the day. A responsible individual must stay with you for 24 hours following the procedure.  For the next 24 hours, DO NOT: -Drive a car -Advertising copywriter -Drink alcoholic beverages -Take any medication unless instructed by your physician -Make any legal decisions or sign important papers.  Meals: Start with liquid foods such as gelatin or soup. Progress to regular foods as tolerated. Avoid greasy, spicy, heavy foods. If nausea and/or vomiting occur, drink only clear liquids until the nausea and/or vomiting subsides. Call your physician if vomiting continues.  Special Instructions/Symptoms: Your throat may feel dry or sore from the anesthesia or the breathing tube placed in your throat during surgery. If this causes discomfort, gargle with warm salt water. The discomfort should disappear within 24 hours.  If you had a scopolamine patch placed behind your ear for the management of post- operative nausea and/or vomiting:  1. The medication in the patch is effective for 72 hours, after which it should be removed.  Wrap patch in a tissue and discard in the trash. Wash hands thoroughly with soap and water. 2. You may remove the patch earlier than 72 hours if you experience unpleasant side effects which may include dry mouth, dizziness or visual disturbances. 3. Avoid touching the patch. Wash your hands with soap and water after contact with the patch.     Information for Discharge Teaching: EXPAREL (bupivacaine liposome injectable suspension)   Your surgeon or anesthesiologist gave you EXPAREL(bupivacaine) to help control your pain after surgery.   EXPAREL is a local anesthetic that provides pain relief by  numbing the tissue around the surgical site.  EXPAREL is designed to release pain medication over time and can control pain for up to 72 hours.  Depending on how you respond to EXPAREL, you may require less pain medication during your recovery.  Possible side effects:  Temporary loss of sensation or ability to move in the area where bupivacaine was injected.  Nausea, vomiting, constipation  Rarely, numbness and tingling in your mouth or lips, lightheadedness, or anxiety may occur.  Call your doctor right away if you think you may be experiencing any of these sensations, or if you have other questions regarding possible side effects.  Follow all other discharge instructions given to you by your surgeon or nurse. Eat a healthy diet and drink plenty of water or other fluids.  If you return to the hospital for any reason within 96 hours following the administration of EXPAREL, it is important for health care providers to know that you have received this anesthetic. A teal colored band has been placed on your arm with the date, time and amount of EXPAREL you have received in order to alert and inform your health care providers. Please leave this armband in place for the full 96 hours following administration, and then you may remove the band.

## 2020-02-09 NOTE — Op Note (Signed)
Preop diagnosis: Axillary lymphadenopathy Postop diagnosis: Same Procedure performed: Right targeted axillary lymph node biopsy Surgeon:Lyanne Kates K Laurence Crofford Anesthesia: General via LMA/pec block Indications: This is a 39 year old female who has been followed with mammograms for asymmetry of the right breast who recently was found to have bilateral axillary lymphadenopathy.  Biopsy showed reactive lymphoid hyperplasia and a right axillary lymph node.  This was felt to be discordant so she presents now for excisional lymph node biopsy.  A radioactive seed was placed into the biopsied axillary lymph node yesterday.  Description of procedure: The patient is brought to the operating room and placed in the supine position on the operating room table.  After an adequate level of general anesthesia was obtained, the patient's right chest and axilla were prepped with ChloraPrep and draped in sterile fashion.  A timeout was taken to ensure the proper patient and proper procedure.  I interrogated the axilla with the neoprobe.  There is an area of increased activity.  We made a transverse incision in this area.  We dissected down into the axillary contents with cautery.  The neoprobe was used to guide [place] towards the biopsy lymph node.  This was just under the edge of the pectoralis muscle.  The lymph node is quite large.  We were able to dissect around the lymph node.  The vessel was ligated with a clip and the lymph node was excised entirely.  Grossly, it appears to be about 2 cm across.  This was sent for pathologic examination.  The wound was irrigated and inspected for hemostasis.  The wound was closed with a deep layer of 3-0 Vicryl and a subcuticular layer 4-0 Monocryl.  Benzoin and Steri-Strips were applied.  Dry dressings applied.  The patient was then extubated and brought to the recovery room in stable condition.  All sponge, instrument, and needle counts are correct.  Wilmon Arms. Corliss Skains, MD, [facility]  General/ Trauma Surgery   02/09/2020 10:42 AM

## 2020-02-09 NOTE — Anesthesia Procedure Notes (Signed)
Procedure Name: LMA Insertion Date/Time: 02/09/2020 9:37 AM Performed by: Thornell Mule, CRNA Pre-anesthesia Checklist: Patient identified, Emergency Drugs available, Suction available and Patient being monitored Patient Re-evaluated:Patient Re-evaluated prior to induction Oxygen Delivery Method: Circle system utilized Preoxygenation: Pre-oxygenation with 100% oxygen Induction Type: IV induction LMA: LMA inserted LMA Size: 4.0 Number of attempts: 1 Placement Confirmation: positive ETCO2 Tube secured with: Tape Dental Injury: Teeth and Oropharynx as per pre-operative assessment

## 2020-02-09 NOTE — Anesthesia Procedure Notes (Addendum)
Anesthesia Regional Block: Pectoralis block   Pre-Anesthetic Checklist: ,, timeout performed, Correct Patient, Correct Site, Correct Laterality, Correct Procedure, Correct Position, site marked, Risks and benefits discussed,  Surgical consent,  Pre-op evaluation,  At surgeon's request and post-op pain management  Laterality: Right  Prep: chloraprep       Needles:   Needle Type: Stimiplex     Needle Length: 9cm  Needle Gauge: 21     Additional Needles:   Procedures:,,,, ultrasound used (permanent image in chart),,,,  Narrative:  Start time: 02/09/2020 8:50 AM End time: 02/09/2020 9:00 AM Injection made incrementally with aspirations every 5 mL. Anesthesiologist: Mellody Dance, MD

## 2020-02-09 NOTE — Transfer of Care (Signed)
Immediate Anesthesia Transfer of Care Note  Patient: Katherine Giles  Procedure(s) Performed: RIGHT TARGETED AXILLARY LYMPH NODE BIOPSY (Right Breast)  Patient Location: PACU  Anesthesia Type:General  Level of Consciousness: drowsy, patient cooperative and responds to stimulation  Airway & Oxygen Therapy: Patient Spontanous Breathing and Patient connected to face mask oxygen  Post-op Assessment: Report given to RN and Post -op Vital signs reviewed and stable  Post vital signs: Reviewed and stable  Last Vitals:  Vitals Value Taken Time  BP 106/77 02/09/20 1030  Temp    Pulse 80 02/09/20 1030  Resp 15 02/09/20 1030  SpO2 99 % 02/09/20 1030  Vitals shown include unvalidated device data.  Last Pain:  Vitals:   02/09/20 0746  TempSrc: Oral  PainSc: 0-No pain         Complications: No complications documented.

## 2020-02-09 NOTE — Progress Notes (Signed)
Assisted Dr. Bass with right, ultrasound guided, pectoralis block. Side rails up, monitors on throughout procedure. See vital signs in flow sheet. Tolerated Procedure well. 

## 2020-02-09 NOTE — Anesthesia Preprocedure Evaluation (Addendum)
Anesthesia Evaluation  Patient identified by MRN, date of birth, ID band  Reviewed: Allergy & Precautions, NPO status , Patient's Chart, lab work & pertinent test results  Airway Mallampati: II  TM Distance: >3 FB Neck ROM: Full    Dental no notable dental hx.    Pulmonary neg pulmonary ROS,    Pulmonary exam normal breath sounds clear to auscultation       Cardiovascular Exercise Tolerance: Good negative cardio ROS Normal cardiovascular exam Rhythm:Regular Rate:Normal     Neuro/Psych PSYCHIATRIC DISORDERS Depression negative neurological ROS     GI/Hepatic Neg liver ROS, GERD  ,  Endo/Other  Morbid obesity  Renal/GU negative Renal ROS  negative genitourinary   Musculoskeletal negative musculoskeletal ROS (+)   Abdominal (+) + obese,   Peds  Hematology negative hematology ROS (+)   Anesthesia Other Findings   Reproductive/Obstetrics negative OB ROS                           Anesthesia Physical Anesthesia Plan  ASA: III  Anesthesia Plan: General and Regional   Post-op Pain Management: GA combined w/ Regional for post-op pain   Induction: Intravenous  PONV Risk Score and Plan: 3 and Ondansetron, Dexamethasone and Midazolam  Airway Management Planned: LMA  Additional Equipment:   Intra-op Plan:   Post-operative Plan: Extubation in OR  Informed Consent: I have reviewed the patients History and Physical, chart, labs and discussed the procedure including the risks, benefits and alternatives for the proposed anesthesia with the patient or authorized representative who has indicated his/her understanding and acceptance.       Plan Discussed with:   Anesthesia Plan Comments:         Anesthesia Quick Evaluation

## 2020-02-10 ENCOUNTER — Encounter (HOSPITAL_BASED_OUTPATIENT_CLINIC_OR_DEPARTMENT_OTHER): Payer: Self-pay | Admitting: Surgery

## 2020-02-11 ENCOUNTER — Other Ambulatory Visit: Payer: Self-pay

## 2020-02-11 LAB — SURGICAL PATHOLOGY

## 2020-02-17 LAB — SURGICAL PATHOLOGY

## 2020-02-25 ENCOUNTER — Ambulatory Visit: Payer: Self-pay | Admitting: Surgery

## 2020-03-03 ENCOUNTER — Telehealth: Payer: Self-pay | Admitting: Hematology & Oncology

## 2020-03-03 NOTE — Telephone Encounter (Signed)
lmom to inform of new patient appointment 10/11 at 3pm

## 2020-04-03 ENCOUNTER — Inpatient Hospital Stay: Payer: BC Managed Care – PPO | Attending: Hematology & Oncology | Admitting: Hematology & Oncology

## 2020-04-03 ENCOUNTER — Other Ambulatory Visit: Payer: Self-pay

## 2020-04-03 ENCOUNTER — Inpatient Hospital Stay: Payer: BC Managed Care – PPO

## 2020-04-03 VITALS — BP 132/92 | HR 77 | Temp 99.3°F | Resp 17 | Ht 64.0 in | Wt 244.0 lb

## 2020-04-03 DIAGNOSIS — L723 Sebaceous cyst: Secondary | ICD-10-CM

## 2020-04-03 DIAGNOSIS — R590 Localized enlarged lymph nodes: Secondary | ICD-10-CM

## 2020-04-03 LAB — CBC WITH DIFFERENTIAL (CANCER CENTER ONLY)
Abs Immature Granulocytes: 0.05 10*3/uL (ref 0.00–0.07)
Basophils Absolute: 0 10*3/uL (ref 0.0–0.1)
Basophils Relative: 0 %
Eosinophils Absolute: 0.1 10*3/uL (ref 0.0–0.5)
Eosinophils Relative: 1 %
HCT: 42.3 % (ref 36.0–46.0)
Hemoglobin: 14.2 g/dL (ref 12.0–15.0)
Immature Granulocytes: 1 %
Lymphocytes Relative: 29 %
Lymphs Abs: 2.1 10*3/uL (ref 0.7–4.0)
MCH: 27.1 pg (ref 26.0–34.0)
MCHC: 33.6 g/dL (ref 30.0–36.0)
MCV: 80.7 fL (ref 80.0–100.0)
Monocytes Absolute: 0.5 10*3/uL (ref 0.1–1.0)
Monocytes Relative: 8 %
Neutro Abs: 4.5 10*3/uL (ref 1.7–7.7)
Neutrophils Relative %: 61 %
Platelet Count: 133 10*3/uL — ABNORMAL LOW (ref 150–400)
RBC: 5.24 MIL/uL — ABNORMAL HIGH (ref 3.87–5.11)
RDW: 12.3 % (ref 11.5–15.5)
WBC Count: 7.2 10*3/uL (ref 4.0–10.5)
nRBC: 0 % (ref 0.0–0.2)

## 2020-04-03 LAB — CMP (CANCER CENTER ONLY)
ALT: 12 U/L (ref 0–44)
AST: 13 U/L — ABNORMAL LOW (ref 15–41)
Albumin: 4.3 g/dL (ref 3.5–5.0)
Alkaline Phosphatase: 62 U/L (ref 38–126)
Anion gap: 8 (ref 5–15)
BUN: 18 mg/dL (ref 6–20)
CO2: 26 mmol/L (ref 22–32)
Calcium: 9.8 mg/dL (ref 8.9–10.3)
Chloride: 104 mmol/L (ref 98–111)
Creatinine: 0.96 mg/dL (ref 0.44–1.00)
GFR, Estimated: >60 mL/min (ref >60)
Glucose, Bld: 93 mg/dL (ref 70–99)
Potassium: 4.3 mmol/L (ref 3.5–5.1)
Sodium: 138 mmol/L (ref 135–145)
Total Bilirubin: 0.4 mg/dL (ref 0.3–1.2)
Total Protein: 6.4 g/dL — ABNORMAL LOW (ref 6.5–8.1)

## 2020-04-03 NOTE — Progress Notes (Signed)
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 New Patient (Initial Visit)  : I want to make sure I do not have any cancer.  HPI: Katherine Giles is a very nice 39 year old white female.  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  She is very interesting to talk to.  She has a dog named Doctor, general practice.  She has a wife who is an EMT.  They have been married 5 years.  She apparently began to note swelling in her right axilla.  I think is actually was found when she was having a mammogram.  Back in June, she had a mammogram.  There was a questionable area in the upper outer quadrant of the right breast.  This was found actually back in 2017.  This been followed yearly.  She subsequently had a mammogram in June.  This did show bilateral axillary enlarged lymph nodes with thickened cortices.  On the right side there was a lymph node measuring 1.2 cm.  She underwent an ultrasound-guided biopsy.  This was done on June 18.  The pathology report ([id number]) showed reactive lymphoid hyperplasia with fibrosis.  There was some concern that this was not an adequate specimen.  As such, she was then referred to surgery.  She was seen by Katherine Giles.  He went ahead and did an excisional biopsy.  This was done on August 20.  The lymph node that was taken out measured 2.5 x 1.8 x 1.4 cm.  The pathology report ([id number]) showed again reactive lymphoid hyperplasia.  There is no evidence of malignancy.  Flow cytometry was negative for any monoclonal cells.  She was then referred to the [facility] to see what else needed to be done.  She has not lost weight.  Her appetite is good.  She has had no fever.  She has had no rashes.  She has had no obvious change in bowel or bladder habits.  She has no issues with the coronavirus.  She has had her vaccines.  There is no history of the family of any type of blood  issue.  She has had no swollen lymph nodes on her neck or in the inguinal area.  Currently, her performance status is ECOG 0.      Past Medical History:  Diagnosis Date  . Depression   . GERD (gastroesophageal reflux disease)   :  Past Surgical History:  Procedure Laterality Date  . AXILLARY LYMPH NODE BIOPSY Right 02/09/2020   Procedure: RIGHT TARGETED AXILLARY LYMPH NODE BIOPSY;  Surgeon: Katherine Rudd, MD;  Location: [facility];  Service: General;  Laterality: Right;  . DILATION AND CURETTAGE OF UTERUS     elective abortion  . TONSILLECTOMY    :   Current Outpatient Medications:  .  Probiotic CAPS, Take 1 capsule by mouth daily., Disp: , Rfl:  .  sertraline (ZOLOFT) 100 MG tablet, Take 100 mg by mouth daily., Disp: , Rfl:  .  calcium carbonate (TUMS - DOSED IN MG ELEMENTAL CALCIUM) 500 MG chewable tablet, Chew 1 tablet by mouth daily as needed. For heartburn, Disp: , Rfl:  .  esomeprazole (NEXIUM) 20 MG capsule, Take 20 mg by mouth daily at 12 noon., Disp: , Rfl:  .  LORazepam (ATIVAN) 0.5 MG tablet, Take 1 mg by mouth as needed., Disp: , Rfl: :  :  Allergies  Allergen Reactions  . Cephalexin Hives  .  Atropine Nausea And Vomiting    Childhood reaction Childhood reaction  . Meperidine Nausea And Vomiting    Childhood reaction Childhood reaction  :  Family History  Problem Relation Age of Onset  . Heart disease Father   . Cancer Paternal Aunt        cervical  . Cancer Paternal Aunt        ovarian  :  Social History   Socioeconomic History  . Marital status: Married    Spouse name: Not on file  . Number of children: Not on file  . Years of education: Not on file  . Highest education level: Not on file  Occupational History  . Not on file  Tobacco Use  . Smoking status: Never Smoker  . Smokeless tobacco: Never Used  Substance and Sexual Activity  . Alcohol use: Yes    Alcohol/week: 2.0 standard drinks    Types: 2 Glasses of wine per  week    Comment: ocaas  . Drug use: No  . Sexual activity: Yes    Birth control/protection: None  Other Topics Concern  . Not on file  Social History Narrative  . Not on file   Social Determinants of Health   Financial Resource Strain:   . Difficulty of Paying Living Expenses: Not on file  Food Insecurity:   . Worried About Programme researcher, broadcasting/film/video in the Last Year: Not on file  . Ran Out of Food in the Last Year: Not on file  Transportation Needs:   . Lack of Transportation (Medical): Not on file  . Lack of Transportation (Non-Medical): Not on file  Physical Activity:   . Days of Exercise per Week: Not on file  . Minutes of Exercise per Session: Not on file  Stress:   . Feeling of Stress : Not on file  Social Connections:   . Frequency of Communication with Friends and Family: Not on file  . Frequency of Social Gatherings with Friends and Family: Not on file  . Attends Religious Services: Not on file  . Active Member of Clubs or Organizations: Not on file  . Attends Banker Meetings: Not on file  . Marital Status: Not on file  Intimate Partner Violence:   . Fear of Current or Ex-Partner: Not on file  . Emotionally Abused: Not on file  . Physically Abused: Not on file  . Sexually Abused: Not on file  :  Review of Systems  Constitutional: Negative.   HENT: Negative.   Eyes: Negative.   Respiratory: Negative.   Cardiovascular: Negative.   Gastrointestinal: Negative.   Genitourinary: Negative.   Musculoskeletal: Negative.   Skin: Negative.   Neurological: Negative.   Endo/Heme/Allergies: Negative.   Psychiatric/Behavioral: Negative.      Exam: This is a well-developed well-nourished white female in no obvious distress.  Vital signs show temperature of 99.3.  Pulse 77.  Blood pressure 132/92.  Weight is 244 pounds.  Head and neck exam shows no ocular or oral lesions.  She has no adenopathy in the cervical or supraclavicular regions.  Her lungs are clear  bilaterally.  Cardiac exam regular rate and rhythm with no murmurs, rubs or bruits.  Axillary exam shows no obvious axillary adenopathy bilaterally.  Abdominal exam is mildly obese but soft.  She has good bowel sounds.  There is no fluid wave.  There is no palpable liver or spleen tip.  There is no palpable inguinal adenopathy bilaterally.  Back exam shows no tenderness over  the spine, ribs or hips.  Extremities shows no clubbing, cyanosis or edema.  She has good range of motion of her joints.  She has good strength in upper and lower extremities.  Skin exam shows no rashes, ecchymoses or petechia.  Neurological exam shows no focal neurological deficits.   @IPVITALS @   Recent Labs    04/03/20 1515  WBC 7.2  HGB 14.2  HCT 42.3  PLT 133*   Recent Labs    04/03/20 1515  NA 138  K 4.3  CL 104  CO2 26  GLUCOSE 93  BUN 18  CREATININE 0.96  CALCIUM 9.8    Blood smear review: None  Pathology: See above    Assessment and Plan: Katherine Giles is a very nice 39 year old white female.  She has reactive lymphoid hyperplasia.  This is biopsied out of the right axilla lymph node.  The pathology report is pretty conclusive that there is no evidence of malignancy or lymphoma in the lymph node.  Why she would have a reactive lymph node is not clear.  I really do not think that we have to pursue a major work-up for other diseases.  Again, there is no cancer.  I suppose that she could always develop lymphoma.  I know that there is a slight increased risk of developing non-Hodgkin's lymphoma in patients with reactive lymphoid hyperplasia.  I cannot find anything on exam that would suggest a collagen vascular issue.  I do not see nothing that looks like a sarcoidosis.  I do not see anything that looks infectious.  Again I would think if there is was any infection that the pathology report would have seen this.  I spent about 45 minutes with Ms. 44.  Again she is very interesting to talk to.  She  is very intelligent.  She asked a lot of good questions.  I think that we should just follow along.  I think that a follow-up CT scan probably in November would be reasonable.  If that CT scan looks okay, then I would probably recommend a 21-month follow-up CT scan.  Katherine Giles is quite busy.  I do still want to inconvenience her and make her come to the office too often and have too many scans that could expose her to radiation.  We will plan to see her back the same day that she has her CT scan in November.

## 2020-04-04 ENCOUNTER — Telehealth: Payer: Self-pay | Admitting: Hematology & Oncology

## 2020-04-04 LAB — LACTATE DEHYDROGENASE: LDH: 179 U/L (ref 98–192)

## 2020-04-04 NOTE — Telephone Encounter (Signed)
Appointments scheduled calendar printed & mailed per 10/11 los 

## 2020-05-11 ENCOUNTER — Other Ambulatory Visit: Payer: Self-pay

## 2020-05-11 ENCOUNTER — Ambulatory Visit (HOSPITAL_BASED_OUTPATIENT_CLINIC_OR_DEPARTMENT_OTHER)
Admission: RE | Admit: 2020-05-11 | Discharge: 2020-05-11 | Disposition: A | Discharge status: Home / Self Care | Payer: BC Managed Care – PPO | Source: Ambulatory Visit | Attending: Hematology & Oncology | Admitting: Hematology & Oncology

## 2020-05-11 ENCOUNTER — Inpatient Hospital Stay: Payer: BC Managed Care – PPO | Attending: Hematology & Oncology

## 2020-05-11 ENCOUNTER — Encounter (HOSPITAL_BASED_OUTPATIENT_CLINIC_OR_DEPARTMENT_OTHER): Payer: Self-pay

## 2020-05-11 ENCOUNTER — Encounter: Payer: Self-pay | Admitting: Hematology & Oncology

## 2020-05-11 ENCOUNTER — Telehealth: Payer: Self-pay

## 2020-05-11 ENCOUNTER — Inpatient Hospital Stay (HOSPITAL_BASED_OUTPATIENT_CLINIC_OR_DEPARTMENT_OTHER): Payer: BC Managed Care – PPO | Admitting: Hematology & Oncology

## 2020-05-11 DIAGNOSIS — R590 Localized enlarged lymph nodes: Secondary | ICD-10-CM

## 2020-05-11 DIAGNOSIS — R599 Enlarged lymph nodes, unspecified: Secondary | ICD-10-CM | POA: Diagnosis not present

## 2020-05-11 DIAGNOSIS — R161 Splenomegaly, not elsewhere classified: Secondary | ICD-10-CM | POA: Insufficient documentation

## 2020-05-11 HISTORY — DX: Splenomegaly, not elsewhere classified: R16.1

## 2020-05-11 LAB — CMP (CANCER CENTER ONLY)
ALT: 11 U/L (ref 0–44)
AST: 11 U/L — ABNORMAL LOW (ref 15–41)
Albumin: 4.2 g/dL (ref 3.5–5.0)
Alkaline Phosphatase: 66 U/L (ref 38–126)
Anion gap: 5 (ref 5–15)
BUN: 13 mg/dL (ref 6–20)
CO2: 30 mmol/L (ref 22–32)
Calcium: 9.5 mg/dL (ref 8.9–10.3)
Chloride: 103 mmol/L (ref 98–111)
Creatinine: 0.85 mg/dL (ref 0.44–1.00)
GFR, Estimated: >60 mL/min (ref >60)
Glucose, Bld: 85 mg/dL (ref 70–99)
Potassium: 4.3 mmol/L (ref 3.5–5.1)
Sodium: 138 mmol/L (ref 135–145)
Total Bilirubin: 0.5 mg/dL (ref 0.3–1.2)
Total Protein: 6.7 g/dL (ref 6.5–8.1)

## 2020-05-11 LAB — CBC WITH DIFFERENTIAL (CANCER CENTER ONLY)
Abs Immature Granulocytes: 0.03 10*3/uL (ref 0.00–0.07)
Basophils Absolute: 0 10*3/uL (ref 0.0–0.1)
Basophils Relative: 0 %
Eosinophils Absolute: 0.1 10*3/uL (ref 0.0–0.5)
Eosinophils Relative: 2 %
HCT: 43.6 % (ref 36.0–46.0)
Hemoglobin: 14.5 g/dL (ref 12.0–15.0)
Immature Granulocytes: 1 %
Lymphocytes Relative: 31 %
Lymphs Abs: 1.7 10*3/uL (ref 0.7–4.0)
MCH: 27.3 pg (ref 26.0–34.0)
MCHC: 33.3 g/dL (ref 30.0–36.0)
MCV: 82 fL (ref 80.0–100.0)
Monocytes Absolute: 0.4 10*3/uL (ref 0.1–1.0)
Monocytes Relative: 8 %
Neutro Abs: 3.3 10*3/uL (ref 1.7–7.7)
Neutrophils Relative %: 58 %
Platelet Count: 153 10*3/uL (ref 150–400)
RBC: 5.32 MIL/uL — ABNORMAL HIGH (ref 3.87–5.11)
RDW: 12.4 % (ref 11.5–15.5)
WBC Count: 5.6 10*3/uL (ref 4.0–10.5)
nRBC: 0 % (ref 0.0–0.2)

## 2020-05-11 LAB — LACTATE DEHYDROGENASE: LDH: 143 U/L (ref 98–192)

## 2020-05-11 LAB — SAVE SMEAR(SSMR), FOR PROVIDER SLIDE REVIEW

## 2020-05-11 MED ORDER — IOHEXOL 300 MG/ML  SOLN
100.0000 mL | Freq: Once | INTRAMUSCULAR | Status: AC | PRN
  Administered 2020-05-11: 80 mL via INTRAVENOUS

## 2020-05-11 NOTE — Telephone Encounter (Signed)
No 05/11/20 LOS    AOM

## 2020-05-11 NOTE — Progress Notes (Signed)
Hematology and Oncology Follow Up Visit  Katherine Giles [id number] [birth year] 39 y.o. 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  This is her second office visit.  We first saw her back in early October.  At that point time, he was not clear as to whether or not there was an actual malignancy.  We went ahead and did a CT scan today.  The CT scan was of the chest.  The CT scan did not show any pathologically enlarged lymph nodes in the right axilla.  She had less then 1 cm lymph nodes in both axilla.  However, there is mention that there was some splenomegaly.  I am not sure exactly what the splenomegaly signifies.  The spleen was not totally evaluated.  We will have to see about doing a ultrasound.  Certainly, there is a possibility that she may have splenomegaly secondary to some nonmalignant condition.  This could be idiopathic splenomegaly.  Of note, she had a coronavirus booster a few weeks ago.  This could certainly have had an effect on the spleen.  I know that splenomegaly can be seen with collagen vascular diseases, such as lupus and rheumatoid arthritis.  I do not see any evidence of this on physical exam.  However, she will see her family doctor and may be get tested for collagen vascular disease.  Obviously, lymphoma-splenic lymphoma-is a possibility.  I would think that there would be some areas of lucencies within the spleen that would suggest this.  We will see about an ultrasound of the spleen.  She has had no abdominal pain.  There is no fever.  She has had no nausea or vomiting.  There is no change in bowel or bladder habits.  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  She is looking forward to a nice break over Thanksgiving to be with her wife's family.  She is had no rashes.  There is no obvious swollen  lymph nodes.  Overall, I would say performance status is ECOG 0.  Medications:  Current Outpatient Medications:  .  calcium carbonate (TUMS - DOSED IN MG ELEMENTAL CALCIUM) 500 MG chewable tablet, Chew 1 tablet by mouth daily as needed. For heartburn, Disp: , Rfl:  .  esomeprazole (NEXIUM) 20 MG capsule, Take 20 mg by mouth daily at 12 noon., Disp: , Rfl:  .  LORazepam (ATIVAN) 0.5 MG tablet, Take 1 mg by mouth as needed., Disp: , Rfl:  .  Probiotic CAPS, Take 1 capsule by mouth daily., Disp: , Rfl:  .  sertraline (ZOLOFT) 100 MG tablet, Take 100 mg by mouth daily., Disp: , Rfl:   Allergies:  Allergies  Allergen Reactions  . Cephalexin Hives  . Atropine Nausea And Vomiting    Childhood reaction Childhood reaction  . Meperidine Nausea And Vomiting    Childhood reaction Childhood reaction    Past Medical History, Surgical history, Social history, and Family History were reviewed and updated.  Review of Systems: Review of Systems  Constitutional: Negative.   HENT:  Negative.   Eyes: Negative.   Respiratory: Negative.   Cardiovascular: Negative.   Gastrointestinal: Negative.   Endocrine: Negative.   Genitourinary: Negative.    Musculoskeletal: Negative.   Skin: Negative.   Neurological: Negative.   Hematological: Negative.   Psychiatric/Behavioral: Negative.  Physical Exam:  weight is 241 lb (109.3 kg). Her oral temperature is 98 F (36.7 C). Her blood pressure is 139/96 (abnormal) and her pulse is 70. Her respiration is 19 and oxygen saturation is 98%.   Wt Readings from Last 3 Encounters:  05/11/20 241 lb (109.3 kg)  04/03/20 244 lb (110.7 kg)  02/09/20 250 lb 14.1 oz (113.8 kg)    Physical Exam Vitals reviewed.  HENT:     Head: Normocephalic and atraumatic.  Eyes:     Pupils: Pupils are equal, round, and reactive to light.  Neck:     Comments: I cannot palpate any adenopathy in the cervical, supraclavicular or axillary regions. Cardiovascular:     Rate  and Rhythm: Normal rate and regular rhythm.     Heart sounds: Normal heart sounds.  Pulmonary:     Effort: Pulmonary effort is normal.     Breath sounds: Normal breath sounds.  Abdominal:     General: Bowel sounds are normal.     Palpations: Abdomen is soft.     Comments: Abdominal exam is soft.  She has no fluid wave.  There is no guarding or rebound tenderness.  I really cannot palpate her liver or spleen.  It is possible of the spleen tip might be palpable deep inspiration just below the left costal margin.  Musculoskeletal:        General: No tenderness or deformity. Normal range of motion.     Cervical back: Normal range of motion.  Lymphadenopathy:     Cervical: No cervical adenopathy.  Skin:    General: Skin is warm and dry.     Findings: No erythema or rash.  Neurological:     Mental Status: She is alert and oriented to person, place, and time.  Psychiatric:        Behavior: Behavior normal.        Thought Content: Thought content normal.        Judgment: Judgment normal.    Lab Results  Component Value Date   WBC 5.6 05/11/2020   HGB 14.5 05/11/2020   HCT 43.6 05/11/2020   MCV 82.0 05/11/2020   PLT 153 05/11/2020     Chemistry      Component Value Date/Time   NA 138 05/11/2020 1055   K 4.3 05/11/2020 1055   CL 103 05/11/2020 1055   CO2 30 05/11/2020 1055   BUN 13 05/11/2020 1055   CREATININE 0.85 05/11/2020 1055      Component Value Date/Time   CALCIUM 9.5 05/11/2020 1055   ALKPHOS 66 05/11/2020 1055   AST 11 (L) 05/11/2020 1055   ALT 11 05/11/2020 1055   BILITOT 0.5 05/11/2020 1055      Impression and Plan: Katherine Giles is is a very nice 39 year old Caucasian female.  She was found to have reactive lymphoid hyperplasia.  This was in a right axillary biopsy.  I think the question now is whether or not there is any significant clinically splenomegaly.  Again, I cannot find anything on exam or with her labs that looks unusual.  However, we do have to do more  thorough evaluation.  We will go ahead with a ultrasound of the spleen.  We will have to see about doing the ultrasound, may be outside of the [facility] system, so it will not be as expensive for Katherine Giles.  She will see her family doctor and see about any testing for collagen vascular disease.  I  told her that if there  is any concern that the spleen might contain a potential malignant process, the only way that we would know would be with a splenectomy.  This certainly would be a last resort option.  I told her that the spleen is not mandatory to have.  It is just a big lymph node.  However, if a splenectomy is ever considered, she will need to be vaccinated before hand.  We will have to see what the ultrasound of the spleen shows.  If there is some splenic enlargement, then we will have to follow this serially and see if there is any changes.  This is a little more complicated than I would have thought.  However, I want to make sure that we did not take a "cavalier attitude" toward this and do a thorough evaluation.   Josph Macho, MD 11/18/20214:53 PM

## 2020-05-15 ENCOUNTER — Telehealth: Payer: Self-pay

## 2020-05-15 NOTE — Telephone Encounter (Signed)
Called and left a message with U/S appt and phone number per orders.... AOM

## 2020-05-16 ENCOUNTER — Telehealth: Payer: Self-pay | Admitting: Hematology & Oncology

## 2020-05-16 NOTE — Telephone Encounter (Signed)
I called and LMVM for patient regarding appt schedule for [facility] in [place].  I also gave her complete instructions for [place] .  Order was faxed to [facility] [phone]

## 2020-05-17 ENCOUNTER — Ambulatory Visit (HOSPITAL_BASED_OUTPATIENT_CLINIC_OR_DEPARTMENT_OTHER): Payer: BC Managed Care – PPO

## 2020-05-24 ENCOUNTER — Other Ambulatory Visit: Payer: Self-pay | Admitting: Hematology & Oncology

## 2020-05-26 ENCOUNTER — Telehealth: Payer: Self-pay

## 2020-05-26 NOTE — Telephone Encounter (Signed)
Received call from pt requesting results from Ultrasound performed at [facility] on 11/26.   Per Dr Myna Hidalgo, "very mild" splenomegaly but nothing to indicate malignancy of any kind. Dr Myna Hidalgo recommends continued observation at this time and f/u in 6-8 weeks.  Pt verbalizes understanding and asks if other organs were visualized. Per [place] report, informed pt "liver, gallbladder, common bile duct, kidneys, pancreas, aorta & IVC show no focal abnormality." Pt verbalizes understanding and appreciation. Message to scheduling for f/u appt. dph

## 2020-05-29 ENCOUNTER — Telehealth: Payer: Self-pay | Admitting: Hematology & Oncology

## 2020-05-29 NOTE — Telephone Encounter (Signed)
Appointments scheduled calendar printed & mailed per 12/3 staff message

## 2020-06-01 ENCOUNTER — Telehealth: Payer: Self-pay | Admitting: Hematology and Oncology

## 2020-06-01 NOTE — Telephone Encounter (Signed)
Received a new pt referral from Dr. Renne Crigler for a 2nd opinion for enlarged lymph nodes. Katherine Giles has been cld and scheduled to see Dr. Al Pimple on 12/27 at 11am. Pt aware to arrive 30 minutes early.

## 2020-06-19 ENCOUNTER — Inpatient Hospital Stay: Payer: BC Managed Care – PPO

## 2020-06-19 ENCOUNTER — Inpatient Hospital Stay: Payer: BC Managed Care – PPO | Attending: Hematology & Oncology | Admitting: Hematology and Oncology

## 2020-06-19 NOTE — Progress Notes (Deleted)
Cancer Center CONSULT NOTE  Patient Care Team: [name], MD as PCP - General (Internal Medicine)  CHIEF COMPLAINTS/PURPOSE OF CONSULTATION:  ***  ASSESSMENT & PLAN:  No problem-specific Assessment & Plan notes found for this encounter.  No orders of the defined types were placed in this encounter.    HISTORY OF PRESENTING ILLNESS:   Katherine Giles 39 y.o. female is here because of abnormal lymphadenopathy.   This is a 39 year old female who was previously followed for some asymmetry of the right breast on mammogram who recently underwent follow-up mammogram. This showed that the area of asymmetry in the upper outer right breast is stable since 2017. However the right axilla showed some enlarged lymph nodes. Ultrasound showed bilateral enlarged axillary lymph node with a 1.2 cm cortical thickness. Subsequently she underwent biopsy which revealed reactive lymphoid hyperplasia with patchy fibrosis of the right axillary lymph node. This was felt to be discordant. She presents now to discuss excisional lymph node biopsy.  She had excisional lymph node biopsy which showed reactive lymphoid hyperplasia.  Confirmatory flow cytometric analysis ([id number]) failed to show any  significant phenotypic abnormalities. The overall changes are  nonspecific but consistent with reactive lymphoid hyperplasia.  She is here for a second opinion.  REVIEW OF SYSTEMS:   Constitutional: Denies fevers, chills or abnormal night sweats Eyes: Denies blurriness of vision, double vision or watery eyes Ears, nose, mouth, throat, and face: Denies mucositis or sore throat Respiratory: Denies cough, dyspnea or wheezes Cardiovascular: Denies palpitation, chest discomfort or lower extremity swelling Gastrointestinal:  Denies nausea, heartburn or change in bowel habits Skin: Denies abnormal skin rashes Lymphatics: Denies new lymphadenopathy or easy bruising Neurological:Denies numbness,  tingling or new weaknesses Behavioral/Psych: Mood is stable, no new changes  All other systems were reviewed with the patient and are negative.  MEDICAL HISTORY:  Past Medical History:  Diagnosis Date  . Depression   . GERD (gastroesophageal reflux disease)   . Splenomegaly, not elsewhere classified 05/11/2020    SURGICAL HISTORY: Past Surgical History:  Procedure Laterality Date  . AXILLARY LYMPH NODE BIOPSY Right 02/09/2020   Procedure: RIGHT TARGETED AXILLARY LYMPH NODE BIOPSY;  Surgeon: Manus Rudd, MD;  Location: [facility];  Service: General;  Laterality: Right;  . DILATION AND CURETTAGE OF UTERUS     elective abortion  . TONSILLECTOMY      SOCIAL HISTORY: Social History   Socioeconomic History  . Marital status: Married    Spouse name: Not on file  . Number of children: Not on file  . Years of education: Not on file  . Highest education level: Not on file  Occupational History  . Not on file  Tobacco Use  . Smoking status: Never Smoker  . Smokeless tobacco: Never Used  Substance and Sexual Activity  . Alcohol use: Yes    Alcohol/week: 2.0 standard drinks    Types: 2 Glasses of wine per week    Comment: ocaas  . Drug use: No  . Sexual activity: Yes    Birth control/protection: None  Other Topics Concern  . Not on file  Social History Narrative  . Not on file   Social Determinants of Health   Financial Resource Strain: Not on file  Food Insecurity: Not on file  Transportation Needs: Not on file  Physical Activity: Not on file  Stress: Not on file  Social Connections: Not on file  Intimate Partner Violence: Not on file    FAMILY HISTORY: Family  History  Problem Relation Age of Onset  . Heart disease Father   . Cancer Paternal Aunt        cervical  . Cancer Paternal Aunt        ovarian    ALLERGIES:  is allergic to cephalexin, atropine, and meperidine.  MEDICATIONS:  Current Outpatient Medications  Medication Sig Dispense  Refill  . calcium carbonate (TUMS - DOSED IN MG ELEMENTAL CALCIUM) 500 MG chewable tablet Chew 1 tablet by mouth daily as needed. For heartburn    . esomeprazole (NEXIUM) 20 MG capsule Take 20 mg by mouth daily at 12 noon.    Marland Kitchen LORazepam (ATIVAN) 0.5 MG tablet Take 1 mg by mouth as needed.    . Probiotic CAPS Take 1 capsule by mouth daily.    . sertraline (ZOLOFT) 100 MG tablet Take 100 mg by mouth daily.     No current facility-administered medications for this visit.     PHYSICAL EXAMINATION: ECOG PERFORMANCE STATUS: {CHL ONC ECOG PS:[id number]}  There were no vitals filed for this visit. There were no vitals filed for this visit.  GENERAL:alert, no distress and comfortable SKIN: skin color, texture, turgor are normal, no rashes or significant lesions EYES: normal, conjunctiva are pink and non-injected, sclera clear OROPHARYNX:no exudate, no erythema and lips, buccal mucosa, and tongue normal  NECK: supple, thyroid normal size, non-tender, without nodularity LYMPH:  no palpable lymphadenopathy in the cervical, axillary or inguinal LUNGS: clear to auscultation and percussion with normal breathing effort HEART: regular rate & rhythm and no murmurs and no lower extremity edema ABDOMEN:abdomen soft, non-tender and normal bowel sounds Musculoskeletal:no cyanosis of digits and no clubbing  PSYCH: alert & oriented x 3 with fluent speech NEURO: no focal motor/sensory deficits  LABORATORY DATA:  I have reviewed the data as listed Lab Results  Component Value Date   WBC 5.6 05/11/2020   HGB 14.5 05/11/2020   HCT 43.6 05/11/2020   MCV 82.0 05/11/2020   PLT 153 05/11/2020     Chemistry      Component Value Date/Time   NA 138 05/11/2020 1055   K 4.3 05/11/2020 1055   CL 103 05/11/2020 1055   CO2 30 05/11/2020 1055   BUN 13 05/11/2020 1055   CREATININE 0.85 05/11/2020 1055      Component Value Date/Time   CALCIUM 9.5 05/11/2020 1055   ALKPHOS 66 05/11/2020 1055   AST 11 (L)  05/11/2020 1055   ALT 11 05/11/2020 1055   BILITOT 0.5 05/11/2020 1055       RADIOGRAPHIC STUDIES: I have personally reviewed the radiological images as listed and agreed with the findings in the report. No results found.  All questions were answered. The patient knows to call the clinic with any problems, questions or concerns. I spent {CHL ONC TIME VISIT - PFXTK:[id number]} counseling the patient face to face. The total time spent in the appointment was {CHL ONC TIME VISIT - JMEQA:[id number]} and more than 50% was on counseling.     Rachel Moulds, MD 06/19/2020 9:34 AM

## 2020-07-28 ENCOUNTER — Other Ambulatory Visit: Payer: Self-pay | Admitting: *Deleted

## 2020-07-28 DIAGNOSIS — R161 Splenomegaly, not elsewhere classified: Secondary | ICD-10-CM

## 2020-07-31 ENCOUNTER — Inpatient Hospital Stay: Payer: BC Managed Care – PPO | Admitting: Hematology & Oncology

## 2020-07-31 ENCOUNTER — Telehealth: Payer: Self-pay | Admitting: *Deleted

## 2020-07-31 ENCOUNTER — Encounter: Payer: Self-pay | Admitting: Hematology & Oncology

## 2020-07-31 ENCOUNTER — Inpatient Hospital Stay: Payer: BC Managed Care – PPO | Attending: Hematology & Oncology

## 2020-07-31 ENCOUNTER — Other Ambulatory Visit: Payer: Self-pay

## 2020-07-31 VITALS — BP 128/91 | HR 81 | Temp 98.8°F | Resp 16 | Wt 247.0 lb

## 2020-07-31 DIAGNOSIS — R599 Enlarged lymph nodes, unspecified: Secondary | ICD-10-CM | POA: Diagnosis present

## 2020-07-31 DIAGNOSIS — R161 Splenomegaly, not elsewhere classified: Secondary | ICD-10-CM | POA: Insufficient documentation

## 2020-07-31 LAB — COMPREHENSIVE METABOLIC PANEL
ALT: 26 U/L (ref 0–44)
AST: 15 U/L (ref 15–41)
Albumin: 4.3 g/dL (ref 3.5–5.0)
Alkaline Phosphatase: 76 U/L (ref 38–126)
Anion gap: 5 (ref 5–15)
BUN: 16 mg/dL (ref 6–20)
CO2: 31 mmol/L (ref 22–32)
Calcium: 9.6 mg/dL (ref 8.9–10.3)
Chloride: 100 mmol/L (ref 98–111)
Creatinine, Ser: 0.88 mg/dL (ref 0.44–1.00)
GFR, Estimated: >60 mL/min (ref >60)
Glucose, Bld: 80 mg/dL (ref 70–99)
Potassium: 4.4 mmol/L (ref 3.5–5.1)
Sodium: 136 mmol/L (ref 135–145)
Total Bilirubin: 0.5 mg/dL (ref 0.3–1.2)
Total Protein: 6.4 g/dL — ABNORMAL LOW (ref 6.5–8.1)

## 2020-07-31 LAB — CBC WITH DIFFERENTIAL (CANCER CENTER ONLY)
Abs Immature Granulocytes: 0.05 10*3/uL (ref 0.00–0.07)
Basophils Absolute: 0 10*3/uL (ref 0.0–0.1)
Basophils Relative: 0 %
Eosinophils Absolute: 0.1 10*3/uL (ref 0.0–0.5)
Eosinophils Relative: 1 %
HCT: 43.5 % (ref 36.0–46.0)
Hemoglobin: 14.7 g/dL (ref 12.0–15.0)
Immature Granulocytes: 1 %
Lymphocytes Relative: 27 %
Lymphs Abs: 1.7 10*3/uL (ref 0.7–4.0)
MCH: 26.9 pg (ref 26.0–34.0)
MCHC: 33.8 g/dL (ref 30.0–36.0)
MCV: 79.7 fL — ABNORMAL LOW (ref 80.0–100.0)
Monocytes Absolute: 0.6 10*3/uL (ref 0.1–1.0)
Monocytes Relative: 9 %
Neutro Abs: 3.9 10*3/uL (ref 1.7–7.7)
Neutrophils Relative %: 62 %
Platelet Count: 151 10*3/uL (ref 150–400)
RBC: 5.46 MIL/uL — ABNORMAL HIGH (ref 3.87–5.11)
RDW: 12.8 % (ref 11.5–15.5)
WBC Count: 6.3 10*3/uL (ref 4.0–10.5)
nRBC: 0 % (ref 0.0–0.2)

## 2020-07-31 LAB — SAVE SMEAR(SSMR), FOR PROVIDER SLIDE REVIEW

## 2020-07-31 LAB — LACTATE DEHYDROGENASE: LDH: 149 U/L (ref 98–192)

## 2020-07-31 NOTE — Progress Notes (Signed)
Hematology and Oncology Follow Up Visit  ADLINE KIRSHENBAUM [id number] 22-Feb-1981 40 y.o. 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 Stolze is back for follow-up.  We last saw her back in November.  We actually did get a ultrasound of her abdomen.  This was done at [facility] down in [place] as this is a lot easier for her.  The ultrasound showed that there was mild splenomegaly with the spleen measuring 15.1 cm.  Everything else looked fine without any adenopathy, no cirrhosis, pancreas all look fine.  I have to believe that the splenomegaly is probably going to be idiopathic.  She has no other symptoms that would lead to splenomegaly.  She had a nice holiday season.  She was with her wife.  They both enjoyed the holidays.  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  She has had no problems with abdominal pain.  She has had no bleeding.  There is no fever.  She is very diligent with avoiding the coronavirus.  She has had no issues with rashes.  There is no joint problems.  Overall, I would say her performance status is ECOG 0.    Medications:  Current Outpatient Medications:  .  calcium carbonate (TUMS - DOSED IN MG ELEMENTAL CALCIUM) 500 MG chewable tablet, Chew 1 tablet by mouth daily as needed. For heartburn, Disp: , Rfl:  .  esomeprazole (NEXIUM) 20 MG capsule, Take 20 mg by mouth daily at 12 noon., Disp: , Rfl:  .  LORazepam (ATIVAN) 0.5 MG tablet, Take 1 mg by mouth as needed., Disp: , Rfl:  .  Probiotic CAPS, Take 1 capsule by mouth daily., Disp: , Rfl:  .  sertraline (ZOLOFT) 100 MG tablet, Take 100 mg by mouth daily., Disp: , Rfl:   Allergies:  Allergies  Allergen Reactions  . Cephalexin Hives  . Atropine Nausea And Vomiting    Childhood reaction Childhood reaction  . Meperidine Nausea And Vomiting    Childhood reaction Childhood  reaction    Past Medical History, Surgical history, Social history, and Family History were reviewed and updated.  Review of Systems: Review of Systems  Constitutional: Negative.   HENT:  Negative.   Eyes: Negative.   Respiratory: Negative.   Cardiovascular: Negative.   Gastrointestinal: Negative.   Endocrine: Negative.   Genitourinary: Negative.    Musculoskeletal: Negative.   Skin: Negative.   Neurological: Negative.   Hematological: Negative.   Psychiatric/Behavioral: Negative.     Physical Exam:  vitals were not taken for this visit.   Wt Readings from Last 3 Encounters:  05/11/20 241 lb (109.3 kg)  04/03/20 244 lb (110.7 kg)  02/09/20 250 lb 14.1 oz (113.8 kg)    Physical Exam Vitals reviewed.  HENT:     Head: Normocephalic and atraumatic.  Eyes:     Pupils: Pupils are equal, round, and reactive to light.  Neck:     Comments: I cannot palpate any adenopathy in the cervical, supraclavicular or axillary regions. Cardiovascular:     Rate and Rhythm: Normal rate and regular rhythm.     Heart sounds: Normal heart sounds.  Pulmonary:     Effort: Pulmonary effort is normal.     Breath sounds: Normal breath sounds.  Abdominal:     General: Bowel sounds are normal.     Palpations: Abdomen is  soft.     Comments: Abdominal exam is soft.  She has no fluid wave.  There is no guarding or rebound tenderness.  I really cannot palpate her liver or spleen.  It is possible of the spleen tip might be palpable deep inspiration just below the left costal margin.  Musculoskeletal:        General: No tenderness or deformity. Normal range of motion.     Cervical back: Normal range of motion.  Lymphadenopathy:     Cervical: No cervical adenopathy.  Skin:    General: Skin is warm and dry.     Findings: No erythema or rash.  Neurological:     Mental Status: She is alert and oriented to person, place, and time.  Psychiatric:        Behavior: Behavior normal.        Thought  Content: Thought content normal.        Judgment: Judgment normal.    Lab Results  Component Value Date   WBC 6.3 07/31/2020   HGB 14.7 07/31/2020   HCT 43.5 07/31/2020   MCV 79.7 (L) 07/31/2020   PLT 151 07/31/2020     Chemistry      Component Value Date/Time   NA 136 07/31/2020 1007   K 4.4 07/31/2020 1007   CL 100 07/31/2020 1007   CO2 31 07/31/2020 1007   BUN 16 07/31/2020 1007   CREATININE 0.88 07/31/2020 1007   CREATININE 0.85 05/11/2020 1055      Component Value Date/Time   CALCIUM 9.6 07/31/2020 1007   ALKPHOS 76 07/31/2020 1007   AST 15 07/31/2020 1007   AST 11 (L) 05/11/2020 1055   ALT 26 07/31/2020 1007   ALT 11 05/11/2020 1055   BILITOT 0.5 07/31/2020 1007   BILITOT 0.5 05/11/2020 1055      Impression and Plan: Ms. Esker is is a very nice 40 year old Caucasian female.  She was found to have reactive lymphoid hyperplasia.  This was in a right axillary biopsy.  I believe that the way to go now is just to follow her up with a another ultrasound of the spleen.  I think this would be reasonable to get.  I think an ultrasound in the springtime would be reasonable.  I will set 1 up for April.  I still would be very surprised if this was anything malignant.  Again, only way that we would find out is to do a splenectomy.  I would not recommend this at all right now.  She is to see her gynecologist for her annual checkup.  She might want to see Dr. Hyacinth Meeker in our building.  I made that recommendation.  Ms. Bonet will call us if she would like to have an appointment with Dr. Hyacinth Meeker.  I will see Ms. Fite back in May.  If there is no change in her splenomegaly on ultrasound, then we can try to move her appointments out little bit longer.  She is very busy and I want to make sure we do not interfere with her life.     Josph Macho, MD 2/7/202210:39 AM

## 2020-07-31 NOTE — Telephone Encounter (Signed)
Called patient and left message of upcoming appts and mail calendar.

## 2020-09-04 ENCOUNTER — Telehealth: Payer: Self-pay | Admitting: *Deleted

## 2020-09-04 NOTE — Telephone Encounter (Signed)
Received a call from patient asking why her ultrasound has not been scheduled yet.  Patient also states she has a round uncomfortable spot on her rib area that she was concerned about.  Returned patient call but got answering machine and explained the process of scheduling an ultrasound.  Asked patient to call us back to discuss the round spot on her rib that she was concerned about.

## 2020-09-12 ENCOUNTER — Telehealth: Payer: Self-pay | Admitting: *Deleted

## 2020-09-12 NOTE — Telephone Encounter (Signed)
Returned patient's phone call regarding a firm bump under her right rib area. Patient stated,"There is a spot under my right rib area, its firmer than a marshmallow but not as hard as a rock. My wife is a paramedic and felt the area too. I can't eat very much because I get full very quickly. I do have acid reflux. It feels more like gas after eating. It aches after I eat, more like bloating. I don't need to take any pain medications for the discomfort." She did deny having any nausea, vomiting or diarrhea after eating. I told her that I would give this message to Dr. Myna Hidalgo. Also, check on the scheduling of the US abdomen complete. She verbalized understanding.

## 2020-09-15 ENCOUNTER — Ambulatory Visit (HOSPITAL_BASED_OUTPATIENT_CLINIC_OR_DEPARTMENT_OTHER)
Admission: RE | Admit: 2020-09-15 | Discharge: 2020-09-15 | Disposition: A | Discharge status: Home / Self Care | Payer: BC Managed Care – PPO | Source: Ambulatory Visit | Attending: Hematology & Oncology | Admitting: Hematology & Oncology

## 2020-09-15 ENCOUNTER — Other Ambulatory Visit: Payer: Self-pay

## 2020-09-15 DIAGNOSIS — R161 Splenomegaly, not elsewhere classified: Secondary | ICD-10-CM | POA: Insufficient documentation

## 2020-09-19 ENCOUNTER — Telehealth: Payer: Self-pay | Admitting: *Deleted

## 2020-09-19 NOTE — Telephone Encounter (Signed)
As noted below by Dr. Myna Hidalgo, I informed the patient that her spleen is a little smaller. Instructed her to call the office if she had any questions or concerns.

## 2020-09-19 NOTE — Telephone Encounter (Signed)
-----   Message from Josph Macho, MD sent at 09/18/2020  2:39 PM EDT ----- Call - the spleen is a little smaller!!  This is good!!  Consulting civil engineer

## 2020-10-04 ENCOUNTER — Telehealth: Payer: Self-pay | Admitting: *Deleted

## 2020-10-04 NOTE — Telephone Encounter (Signed)
Returned patient's phone call regarding Ultrasound bill. I left a message for her with [facility] phone number...[phone] or Toll Free at [phone].

## 2020-10-27 ENCOUNTER — Inpatient Hospital Stay (HOSPITAL_BASED_OUTPATIENT_CLINIC_OR_DEPARTMENT_OTHER): Payer: BC Managed Care – PPO | Admitting: Hematology & Oncology

## 2020-10-27 ENCOUNTER — Inpatient Hospital Stay: Payer: BC Managed Care – PPO | Attending: Hematology & Oncology

## 2020-10-27 ENCOUNTER — Other Ambulatory Visit: Payer: Self-pay

## 2020-10-27 ENCOUNTER — Encounter: Payer: Self-pay | Admitting: Hematology & Oncology

## 2020-10-27 VITALS — BP 117/72 | HR 77 | Temp 99.1°F | Resp 17 | Wt 250.0 lb

## 2020-10-27 DIAGNOSIS — R161 Splenomegaly, not elsewhere classified: Secondary | ICD-10-CM | POA: Insufficient documentation

## 2020-10-27 DIAGNOSIS — R590 Localized enlarged lymph nodes: Secondary | ICD-10-CM | POA: Diagnosis not present

## 2020-10-27 LAB — CBC WITH DIFFERENTIAL (CANCER CENTER ONLY)
Abs Immature Granulocytes: 0.05 10*3/uL (ref 0.00–0.07)
Basophils Absolute: 0 10*3/uL (ref 0.0–0.1)
Basophils Relative: 0 %
Eosinophils Absolute: 0.1 10*3/uL (ref 0.0–0.5)
Eosinophils Relative: 1 %
HCT: 41.9 % (ref 36.0–46.0)
Hemoglobin: 14.2 g/dL (ref 12.0–15.0)
Immature Granulocytes: 1 %
Lymphocytes Relative: 25 %
Lymphs Abs: 1.6 10*3/uL (ref 0.7–4.0)
MCH: 27.3 pg (ref 26.0–34.0)
MCHC: 33.9 g/dL (ref 30.0–36.0)
MCV: 80.6 fL (ref 80.0–100.0)
Monocytes Absolute: 0.5 10*3/uL (ref 0.1–1.0)
Monocytes Relative: 8 %
Neutro Abs: 4.1 10*3/uL (ref 1.7–7.7)
Neutrophils Relative %: 65 %
Platelet Count: 142 10*3/uL — ABNORMAL LOW (ref 150–400)
RBC: 5.2 MIL/uL — ABNORMAL HIGH (ref 3.87–5.11)
RDW: 12.6 % (ref 11.5–15.5)
WBC Count: 6.4 10*3/uL (ref 4.0–10.5)
nRBC: 0 % (ref 0.0–0.2)

## 2020-10-27 LAB — CMP (CANCER CENTER ONLY)
ALT: 15 U/L (ref 0–44)
AST: 16 U/L (ref 15–41)
Albumin: 4.1 g/dL (ref 3.5–5.0)
Alkaline Phosphatase: 74 U/L (ref 38–126)
Anion gap: 5 (ref 5–15)
BUN: 15 mg/dL (ref 6–20)
CO2: 30 mmol/L (ref 22–32)
Calcium: 9.4 mg/dL (ref 8.9–10.3)
Chloride: 104 mmol/L (ref 98–111)
Creatinine: 1.22 mg/dL — ABNORMAL HIGH (ref 0.44–1.00)
GFR, Estimated: 57 mL/min — ABNORMAL LOW (ref >60)
Glucose, Bld: 101 mg/dL — ABNORMAL HIGH (ref 70–99)
Potassium: 4.2 mmol/L (ref 3.5–5.1)
Sodium: 139 mmol/L (ref 135–145)
Total Bilirubin: 0.4 mg/dL (ref 0.3–1.2)
Total Protein: 6.2 g/dL — ABNORMAL LOW (ref 6.5–8.1)

## 2020-10-27 LAB — SAVE SMEAR(SSMR), FOR PROVIDER SLIDE REVIEW

## 2020-10-27 LAB — LACTATE DEHYDROGENASE: LDH: 160 U/L (ref 98–192)

## 2020-10-27 NOTE — Progress Notes (Signed)
Hematology and Oncology Follow Up Visit  [name] [id number] 05-11-1981 40 y.o. 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 is back for follow-up.  As always, she has been quite busy.  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  Her last ultrasound was done in March.  At that time, her splenic volume was 703 cm.  This is relatively stable.  She is having more problems with respect to right upper quadrant pain.  I do not know if this might be gallbladder.  On the ultrasound that she had done, the gallbladder looked okay.  However, occasionally be some gallbladder dysfunction.  1 way to find this out is with a emptying scan.  We talked about the cost of all these tests.  She like to try to hold off if possible just because of expenses.  She has had no problem with fever.  There is been no rashes.  She has had no change in bowel or bladder habits.  She has occasional loose stools.  She has had no cough or shortness of breath.  She has had no bleeding.  She has had no leg swelling.  Overall, I would have to say that her performance status is ECOG 1.    Medications:  Current Outpatient Medications:  .  calcium carbonate (TUMS - DOSED IN MG ELEMENTAL CALCIUM) 500 MG chewable tablet, Chew 1 tablet by mouth daily as needed. For heartburn, Disp: , Rfl:  .  esomeprazole (NEXIUM) 20 MG capsule, Take 20 mg by mouth daily at 12 noon., Disp: , Rfl:  .  LORazepam (ATIVAN) 0.5 MG tablet, Take 1 mg by mouth as needed., Disp: , Rfl:  .  Probiotic CAPS, Take 1 capsule by mouth daily., Disp: , Rfl:  .  sertraline (ZOLOFT) 100 MG tablet, Take 100 mg by mouth daily., Disp: , Rfl:   Allergies:  Allergies  Allergen Reactions  . Cephalexin Hives  and Other (See Comments)  . Atropine Sulfate Other (See Comments)  . Meperidine Hcl Other (See Comments)  . Atropine Nausea And Vomiting    Childhood reaction Childhood reaction  . Meperidine Nausea And Vomiting    Childhood reaction Childhood reaction    Past Medical History, Surgical history, Social history, and Family History were reviewed and updated.  Review of Systems: Review of Systems  Constitutional: Negative.   HENT:  Negative.   Eyes: Negative.   Respiratory: Negative.   Cardiovascular: Negative.   Gastrointestinal: Negative.   Endocrine: Negative.   Genitourinary: Negative.    Musculoskeletal: Negative.   Skin: Negative.   Neurological: Negative.   Hematological: Negative.   Psychiatric/Behavioral: Negative.     Physical Exam:  weight is 250 lb (113.4 kg). Her oral temperature is 99.1 F (37.3 C). Her blood pressure is 117/72 and her pulse is 77. Her respiration is 17 and oxygen saturation is 98%.   Wt Readings from Last 3 Encounters:  10/27/20 250 lb (113.4 kg)  07/31/20 247 lb (112 kg)  05/11/20 241 lb (109.3 kg)    Physical Exam Vitals reviewed.  HENT:     Head: Normocephalic and atraumatic.  Eyes:     Pupils: Pupils are equal, round, and reactive to light.  Neck:  Comments: I cannot palpate any adenopathy in the cervical, supraclavicular or axillary regions. Cardiovascular:     Rate and Rhythm: Normal rate and regular rhythm.     Heart sounds: Normal heart sounds.  Pulmonary:     Effort: Pulmonary effort is normal.     Breath sounds: Normal breath sounds.  Abdominal:     General: Bowel sounds are normal.     Palpations: Abdomen is soft.     Comments: Abdominal exam is soft.  She has no fluid wave.  There is no guarding or rebound tenderness.  I really cannot palpate her liver or spleen.  It is possible of the spleen tip might be palpable deep inspiration just below the left costal margin.  Musculoskeletal:        General: No tenderness or  deformity. Normal range of motion.     Cervical back: Normal range of motion.  Lymphadenopathy:     Cervical: No cervical adenopathy.  Skin:    General: Skin is warm and dry.     Findings: No erythema or rash.  Neurological:     Mental Status: She is alert and oriented to person, place, and time.  Psychiatric:        Behavior: Behavior normal.        Thought Content: Thought content normal.        Judgment: Judgment normal.    Lab Results  Component Value Date   WBC 6.4 10/27/2020   HGB 14.2 10/27/2020   HCT 41.9 10/27/2020   MCV 80.6 10/27/2020   PLT 142 (L) 10/27/2020     Chemistry      Component Value Date/Time   NA 139 10/27/2020 1436   K 4.2 10/27/2020 1436   CL 104 10/27/2020 1436   CO2 30 10/27/2020 1436   BUN 15 10/27/2020 1436   CREATININE 1.22 (H) 10/27/2020 1436      Component Value Date/Time   CALCIUM 9.4 10/27/2020 1436   ALKPHOS 74 10/27/2020 1436   AST 16 10/27/2020 1436   ALT 15 10/27/2020 1436   BILITOT 0.4 10/27/2020 1436      Impression and Plan: [name] is is a very nice 40 year old Caucasian female.  She was found to have reactive lymphoid hyperplasia.  This was in a right axillary biopsy.  I just do not think that we are dealing with a hematologic issue.  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  Again there is no malignancy when she had the lymph node biopsy.  Her splenic size is stable.  It seems as if the bigger problem is that this pain in the right upper quadrant.  She probably needs to see a gastroenterologist but again she would like to hold off on this right now.  I feel very confident just watching her.  I do not think we have to do any scans or ultrasounds on her right now.  I just do not believe that we are dealing with a malignancy.  We will plan for a follow-up in 6 months.  I told her that she can always call us before then if she starts having problems and we will be more than happy to move things in the right  direction for her.  I just wanted to enjoy summer.  Sounds like she and her wife will be going down to the beach.  She definitely enjoys going down to the beach.    Josph Macho, MD 5/6/20223:26 PM

## 2020-10-30 ENCOUNTER — Telehealth: Payer: Self-pay

## 2020-10-30 NOTE — Telephone Encounter (Signed)
Called and left a vm with f/u appt per 10/27/20 los   Katherine Giles

## 2021-01-09 IMAGING — CT CT CHEST W/ CM
2 of 3 series · 15 of 36 positions shown, 18 images · IV contrast (omnipaque)
Comparison: None.

CLINICAL DATA: Right axillary lymphadenopathy. Recent surgical
resection.

EXAM:
CT CHEST WITH CONTRAST
TECHNIQUE: Multidetector CT imaging of the chest was performed during
intravenous contrast administration.
CONTRAST:  80mL OMNIPAQUE IOHEXOL 300 MG/ML  SOLN

[Series 2: axial st · axial · 0.97mm/px · z∈[-312,-58]mm · 12 of 149 slices shown, 15 images]
[im 11/149  mediastinal]
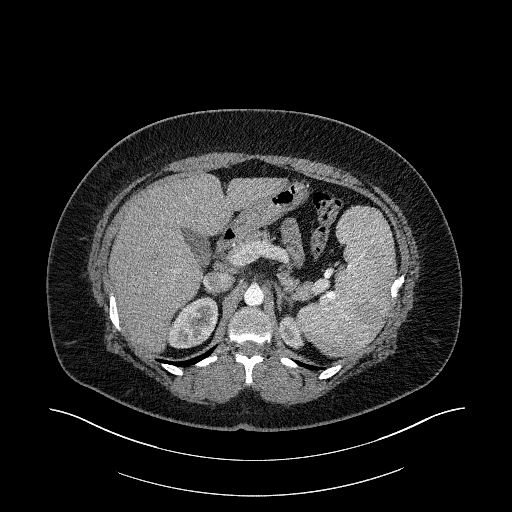
[im 11/149  lung]
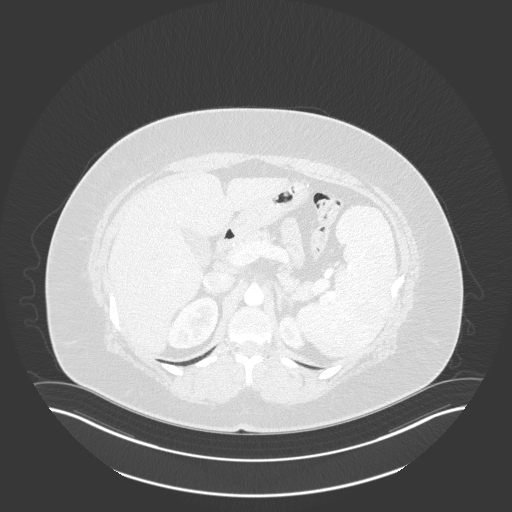
[im 22/149  lung]
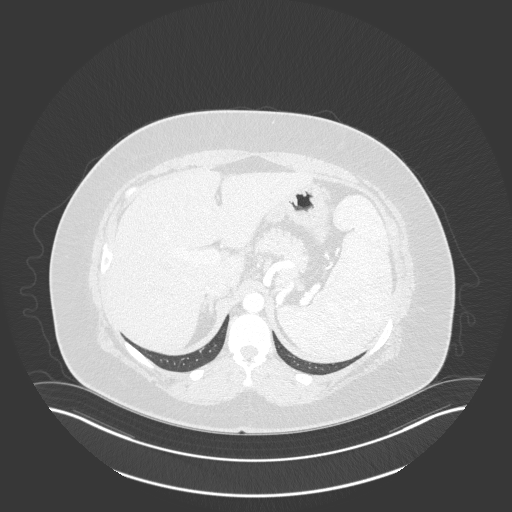
[im 33/149  lung]
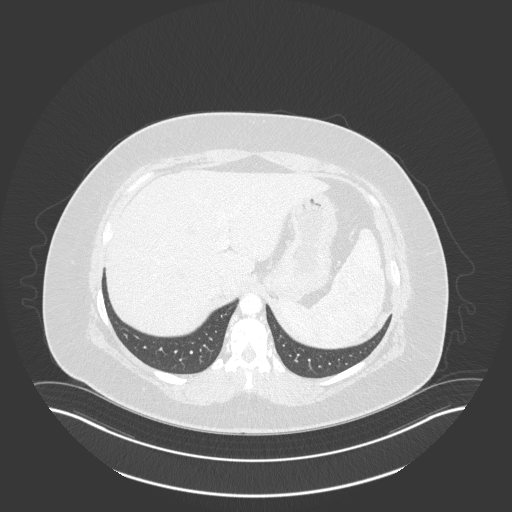
[im 44/149  lung]
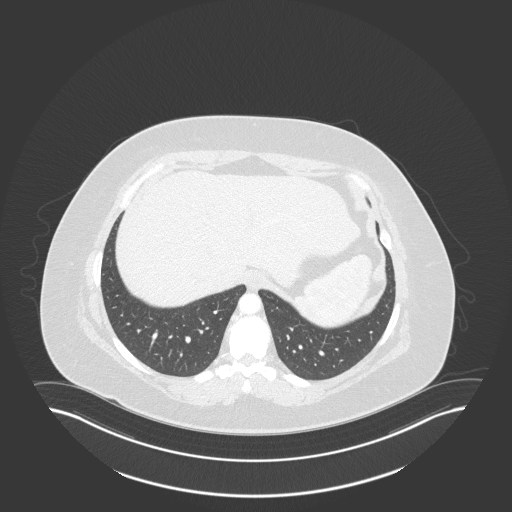
[im 55/149  mediastinal]
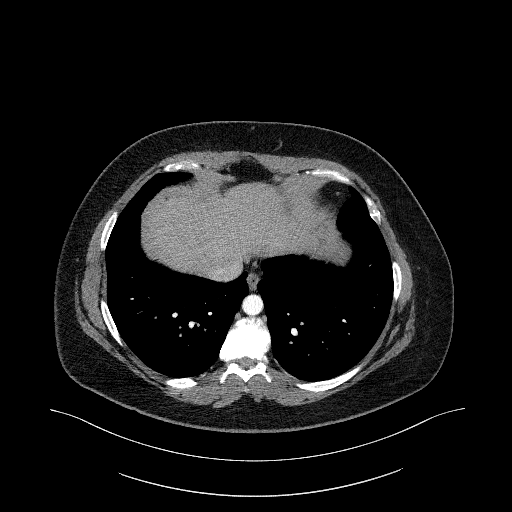
[im 55/149  lung]
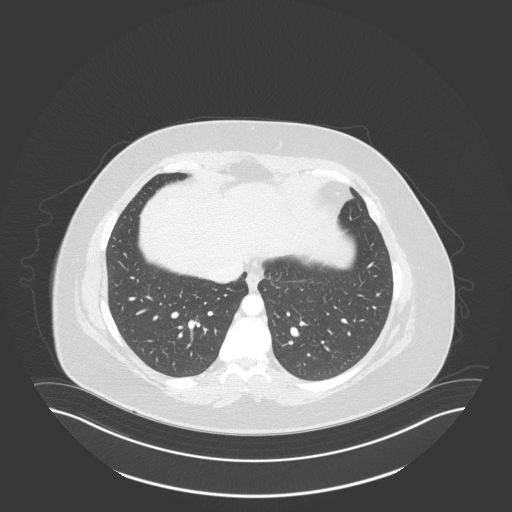
[im 66/149  lung]
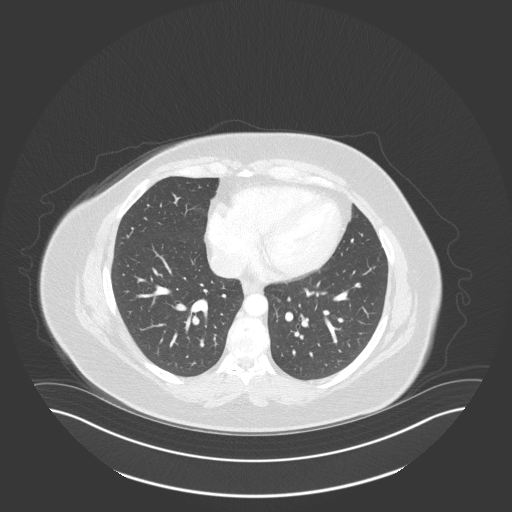
[im 83/149  lung]
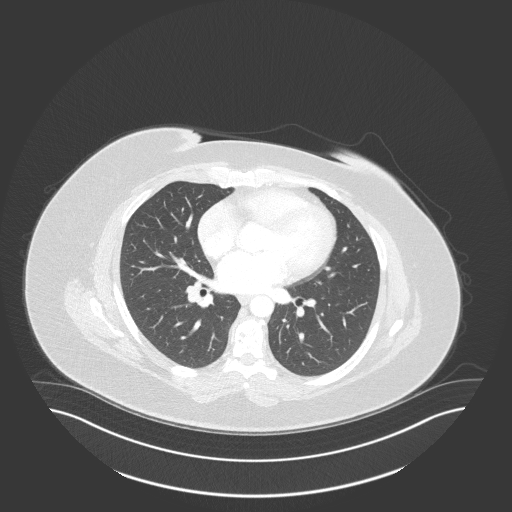
[im 94/149  lung]
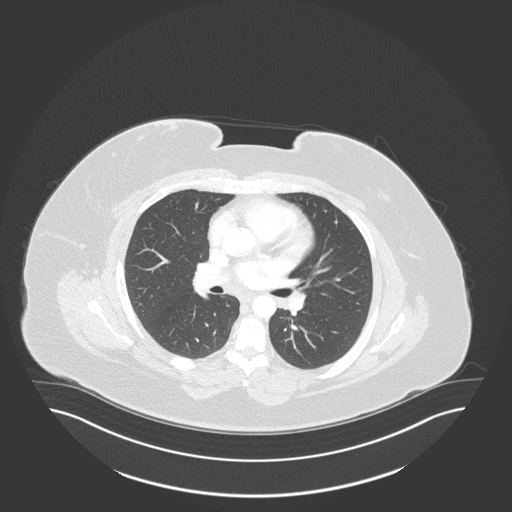
[im 105/149  mediastinal]
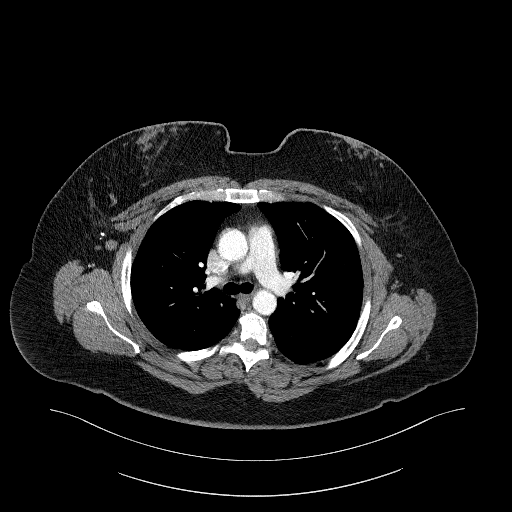
[im 105/149  lung]
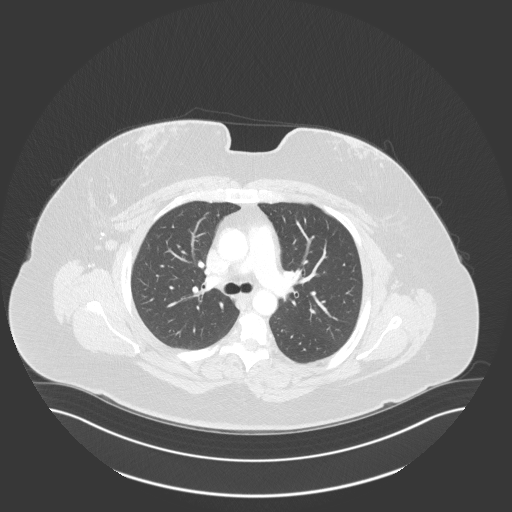
[im 116/149  lung]
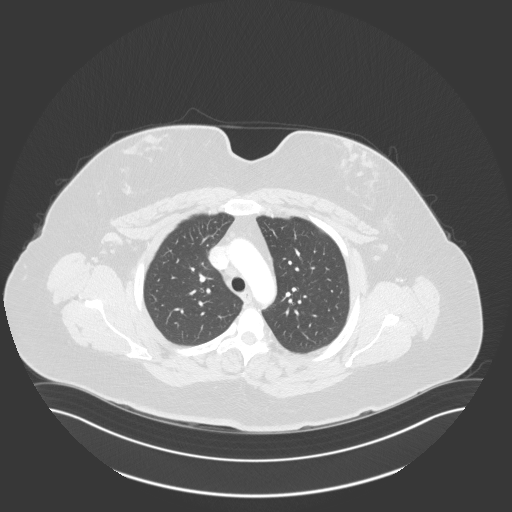
[im 127/149  lung]
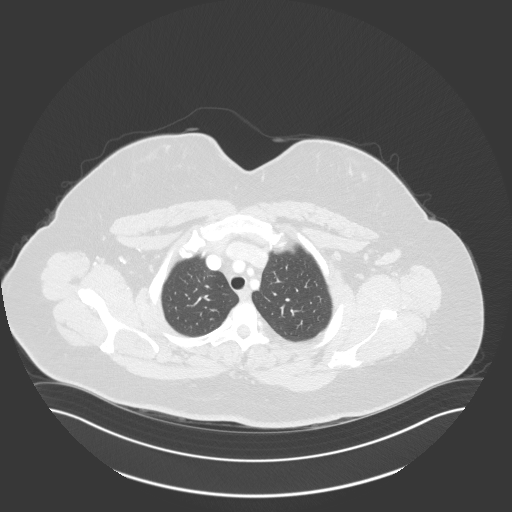
[im 138/149  lung]
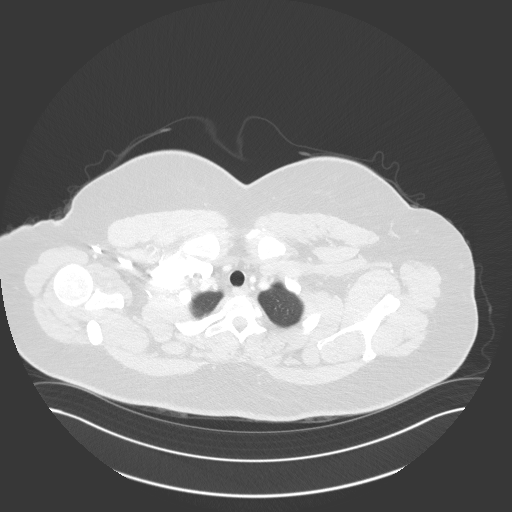

[Series 5: coronal · coronal · 0.58mm/px · 3 of 158 slices shown]
[im 32/158  lung]
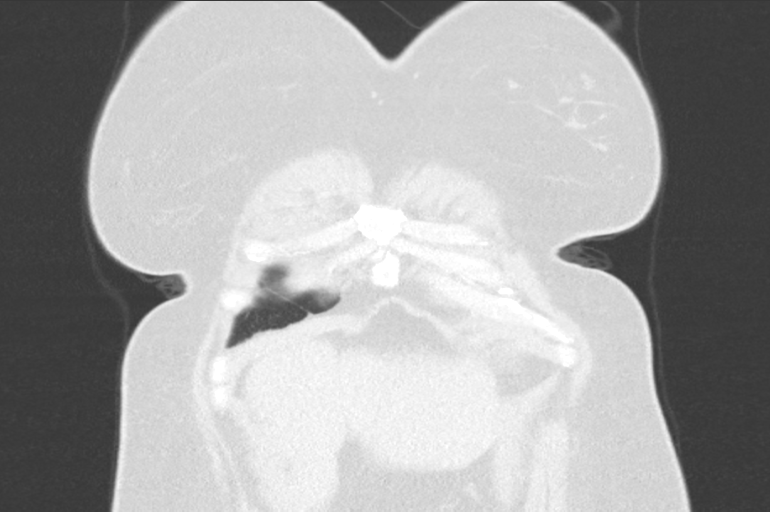
[im 63/158  lung]
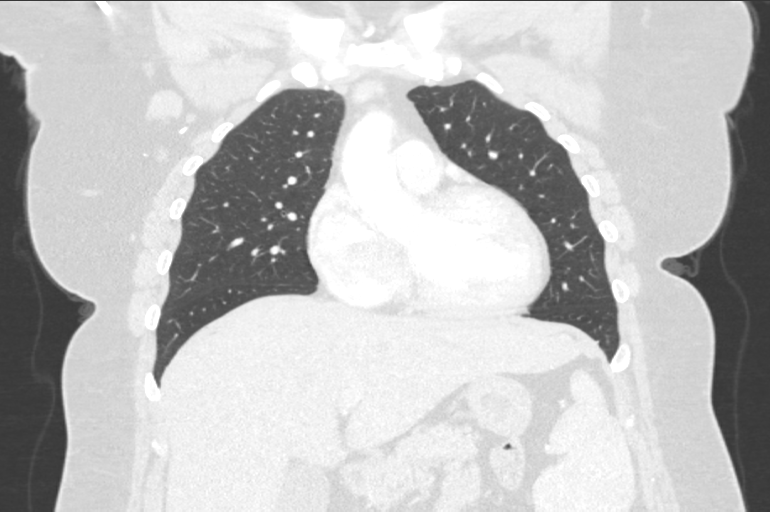
[im 95/158  lung]
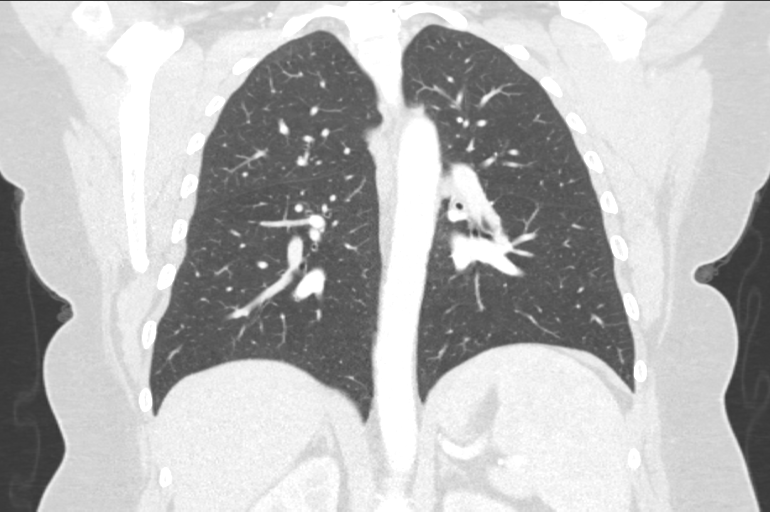

[15 of 36 positions shown; findings below may reference images not displayed]

FINDINGS: Cardiovascular:  No acute findings.

Mediastinum/Nodes: No masses identified. Shotty less than 1 cm lymph
nodes are seen in both axillary regions, with preservation of fatty
hila noted. Surgical clips noted in right axilla. No pathologically
enlarged lymph nodes identified.

Lungs/Pleura: No suspicious nodules or masses identified. No
evidence of pulmonary infiltrate or pleural effusion.

Upper Abdomen:  Spleen is incompletely visualized but is enlarged.

Musculoskeletal:  No suspicious bone lesions.
IMPRESSION: Sub-cm bilateral axillary lymph nodes. No pathologically enlarged
lymph nodes or other masses identified.

Splenomegaly.

## 2021-04-30 ENCOUNTER — Inpatient Hospital Stay (HOSPITAL_BASED_OUTPATIENT_CLINIC_OR_DEPARTMENT_OTHER): Payer: BC Managed Care – PPO | Admitting: Hematology & Oncology

## 2021-04-30 ENCOUNTER — Other Ambulatory Visit: Payer: Self-pay

## 2021-04-30 ENCOUNTER — Inpatient Hospital Stay: Payer: BC Managed Care – PPO | Attending: Hematology & Oncology

## 2021-04-30 ENCOUNTER — Encounter: Payer: Self-pay | Admitting: Hematology & Oncology

## 2021-04-30 VITALS — BP 131/81 | HR 77 | Temp 98.8°F | Resp 18 | Wt 256.0 lb

## 2021-04-30 DIAGNOSIS — R1011 Right upper quadrant pain: Secondary | ICD-10-CM | POA: Diagnosis not present

## 2021-04-30 DIAGNOSIS — R161 Splenomegaly, not elsewhere classified: Secondary | ICD-10-CM

## 2021-04-30 DIAGNOSIS — R599 Enlarged lymph nodes, unspecified: Secondary | ICD-10-CM | POA: Insufficient documentation

## 2021-04-30 LAB — CBC WITH DIFFERENTIAL (CANCER CENTER ONLY)
Abs Immature Granulocytes: 0.03 10*3/uL (ref 0.00–0.07)
Basophils Absolute: 0 10*3/uL (ref 0.0–0.1)
Basophils Relative: 0 %
Eosinophils Absolute: 0.1 10*3/uL (ref 0.0–0.5)
Eosinophils Relative: 2 %
HCT: 40 % (ref 36.0–46.0)
Hemoglobin: 13.5 g/dL (ref 12.0–15.0)
Immature Granulocytes: 1 %
Lymphocytes Relative: 31 %
Lymphs Abs: 2 10*3/uL (ref 0.7–4.0)
MCH: 27.2 pg (ref 26.0–34.0)
MCHC: 33.8 g/dL (ref 30.0–36.0)
MCV: 80.5 fL (ref 80.0–100.0)
Monocytes Absolute: 0.5 10*3/uL (ref 0.1–1.0)
Monocytes Relative: 7 %
Neutro Abs: 3.7 10*3/uL (ref 1.7–7.7)
Neutrophils Relative %: 59 %
Platelet Count: 159 10*3/uL (ref 150–400)
RBC: 4.97 MIL/uL (ref 3.87–5.11)
RDW: 12.6 % (ref 11.5–15.5)
WBC Count: 6.3 10*3/uL (ref 4.0–10.5)
nRBC: 0 % (ref 0.0–0.2)

## 2021-04-30 LAB — CMP (CANCER CENTER ONLY)
ALT: 15 U/L (ref 0–44)
AST: 12 U/L — ABNORMAL LOW (ref 15–41)
Albumin: 4.1 g/dL (ref 3.5–5.0)
Alkaline Phosphatase: 64 U/L (ref 38–126)
Anion gap: 7 (ref 5–15)
BUN: 14 mg/dL (ref 6–20)
CO2: 28 mmol/L (ref 22–32)
Calcium: 9.3 mg/dL (ref 8.9–10.3)
Chloride: 103 mmol/L (ref 98–111)
Creatinine: 0.91 mg/dL (ref 0.44–1.00)
GFR, Estimated: >60 mL/min (ref >60)
Glucose, Bld: 102 mg/dL — ABNORMAL HIGH (ref 70–99)
Potassium: 4.2 mmol/L (ref 3.5–5.1)
Sodium: 138 mmol/L (ref 135–145)
Total Bilirubin: 0.3 mg/dL (ref 0.3–1.2)
Total Protein: 6.1 g/dL — ABNORMAL LOW (ref 6.5–8.1)

## 2021-04-30 LAB — SAVE SMEAR(SSMR), FOR PROVIDER SLIDE REVIEW

## 2021-04-30 LAB — LACTATE DEHYDROGENASE: LDH: 169 U/L (ref 98–192)

## 2021-04-30 NOTE — Progress Notes (Signed)
Hematology and Oncology Follow Up Visit  Katherine Giles [id number] Aug 24, 1980 40 y.o. 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  As always, she has been quite busy.  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  Her main complaint has been this right upper quadrant pain.  I am unsure exactly what is going on.  I think that she has had an ultrasound of this area in the past.  It is possible she may have some gallbladder dysfunction.  She would like to be referred to a gastroenterologist.  I will see if one of the Gastroenterologist on across the hall can see her.  She otherwise she has been doing pretty well.  She apparently is having some thickened uterine lining.  Her gynecologist is following this.  She has had no problems with nausea or vomiting.  Again she seems to have the abdominal pain in the right upper quadrant after she eats.  She is already taking some Nexium.  She is on a probiotic.  I will think she is having any issues with her bowels or bladder.  There is been no fever.  She has had no problems with COVID.  Overall, I would have to say that her performance status is probably ECOG 1.  .  Medications:  Current Outpatient Medications:    calcium carbonate (TUMS - DOSED IN MG ELEMENTAL CALCIUM) 500 MG chewable tablet, Chew 1 tablet by mouth daily as needed. For heartburn, Disp: , Rfl:    esomeprazole (NEXIUM) 20 MG capsule, Take 20 mg by mouth daily at 12 noon., Disp: , Rfl:    LORazepam (ATIVAN) 0.5 MG tablet, Take 1 mg by mouth as needed., Disp: , Rfl:    medroxyPROGESTERone (PROVERA) 10 MG tablet, Take 10 mg by mouth at bedtime., Disp: , Rfl:    Probiotic CAPS, Take 1 capsule by mouth daily., Disp: , Rfl:    sertraline (ZOLOFT) 100 MG tablet, Take 100 mg by mouth daily., Disp: , Rfl:    Allergies:  Allergies  Allergen Reactions   Cephalexin Hives and Other (See Comments)   Atropine Sulfate Other (See Comments)   Meperidine Hcl Other (See Comments)   Atropine Nausea And Vomiting    Childhood reaction Childhood reaction   Meperidine Nausea And Vomiting    Childhood reaction Childhood reaction    Past Medical History, Surgical history, Social history, and Family History were reviewed and updated.  Review of Systems: Review of Systems  Constitutional: Negative.   HENT:  Negative.    Eyes: Negative.   Respiratory: Negative.    Cardiovascular: Negative.   Gastrointestinal: Negative.   Endocrine: Negative.   Genitourinary: Negative.    Musculoskeletal: Negative.   Skin: Negative.   Neurological: Negative.   Hematological: Negative.   Psychiatric/Behavioral: Negative.     Physical Exam:  weight is 256 lb (116.1 kg). Her oral temperature is 98.8 F (37.1 C). Her blood pressure is 131/81 and her pulse is 77. Her respiration is 18 and oxygen saturation is 97%.   Wt Readings from Last 3 Encounters:  04/30/21 256 lb (116.1 kg)  10/27/20 250 lb (113.4 kg)  07/31/20 247 lb (112 kg)    Physical Exam Vitals reviewed.  HENT:     Head: Normocephalic and atraumatic.  Eyes:     Pupils: Pupils  are equal, round, and reactive to light.  Neck:     Comments: I cannot palpate any adenopathy in the cervical, supraclavicular or axillary regions. Cardiovascular:     Rate and Rhythm: Normal rate and regular rhythm.     Heart sounds: Normal heart sounds.  Pulmonary:     Effort: Pulmonary effort is normal.     Breath sounds: Normal breath sounds.  Abdominal:     General: Bowel sounds are normal.     Palpations: Abdomen is soft.     Comments: Abdominal exam is soft.  She has no fluid wave.  There is no guarding or rebound tenderness.  I really cannot palpate her liver or spleen.  It is possible of the spleen tip might be palpable deep inspiration just below the left  costal margin.  Musculoskeletal:        General: No tenderness or deformity. Normal range of motion.     Cervical back: Normal range of motion.  Lymphadenopathy:     Cervical: No cervical adenopathy.  Skin:    General: Skin is warm and dry.     Findings: No erythema or rash.  Neurological:     Mental Status: She is alert and oriented to person, place, and time.  Psychiatric:        Behavior: Behavior normal.        Thought Content: Thought content normal.        Judgment: Judgment normal.   Lab Results  Component Value Date   WBC 6.3 04/30/2021   HGB 13.5 04/30/2021   HCT 40.0 04/30/2021   MCV 80.5 04/30/2021   PLT 159 04/30/2021     Chemistry      Component Value Date/Time   NA 138 04/30/2021 1451   K 4.2 04/30/2021 1451   CL 103 04/30/2021 1451   CO2 28 04/30/2021 1451   BUN 14 04/30/2021 1451   CREATININE 0.91 04/30/2021 1451      Component Value Date/Time   CALCIUM 9.3 04/30/2021 1451   ALKPHOS 64 04/30/2021 1451   AST 12 (L) 04/30/2021 1451   ALT 15 04/30/2021 1451   BILITOT 0.3 04/30/2021 1451      Impression and Plan: Katherine Giles is is a very nice 40 year old Caucasian female.  She was found to have reactive lymphoid hyperplasia.  This was in a right axillary biopsy.  Clinically, her problem is the right upper quadrant pain.  Somehow, it would not surprise me if she had gallbladder dysfunction.  Again, we will see if Gastroenterology will be able to help her out.  I do not think we have to worry about doing a splenic ultrasound at the present time.  I think we does have to see what goes on with this Gastroenterology evaluation.  Her blood counts are certainly okay.  I did look at her blood smear.  I do not see anything that looked suspicious.  She had good platelet numbers.  She had good white cell differential.  I still think we get her back in 6 months.  I think this would be very reasonable.   Josph Macho, MD 11/7/20223:44 PM

## 2021-05-01 ENCOUNTER — Telehealth: Payer: Self-pay | Admitting: General Surgery

## 2021-05-01 NOTE — Telephone Encounter (Signed)
-----   Message from [facility] V, DO sent at 04/30/2021  4:35 PM EST ----- Please schedule OV with me next week for eval for RUQ pain. Referral from [facility]. Ok to [facility] my AM clinic. Thanks.

## 2021-05-01 NOTE — Telephone Encounter (Signed)
Left a voicemail for the patient to contact the officer to schedule an office visit. Referral from Dr Myna Hidalgo.

## 2021-05-02 NOTE — Telephone Encounter (Signed)
Patient called back and scheduled her to come for a NP visit per DR C for RUQ pain ok to double book. Appt 05/09/2021@10 :20

## 2021-05-02 NOTE — Telephone Encounter (Signed)
Tried to contact the patient to have her schedule an appointment

## 2021-05-02 NOTE — Telephone Encounter (Signed)
Inbound call from patient. Attempted to schedule appt, next available that is showing for me is 12/13. Best contact number [phone]

## 2021-05-09 ENCOUNTER — Ambulatory Visit (INDEPENDENT_AMBULATORY_CARE_PROVIDER_SITE_OTHER): Payer: BC Managed Care – PPO | Admitting: Gastroenterology

## 2021-05-09 ENCOUNTER — Encounter: Payer: Self-pay | Admitting: Gastroenterology

## 2021-05-09 ENCOUNTER — Other Ambulatory Visit: Payer: Self-pay

## 2021-05-09 VITALS — BP 138/78 | HR 87 | Ht 64.0 in | Wt 255.0 lb

## 2021-05-09 DIAGNOSIS — R161 Splenomegaly, not elsewhere classified: Secondary | ICD-10-CM

## 2021-05-09 DIAGNOSIS — R1011 Right upper quadrant pain: Secondary | ICD-10-CM | POA: Diagnosis not present

## 2021-05-09 DIAGNOSIS — K219 Gastro-esophageal reflux disease without esophagitis: Secondary | ICD-10-CM

## 2021-05-09 DIAGNOSIS — R140 Abdominal distension (gaseous): Secondary | ICD-10-CM

## 2021-05-09 NOTE — Progress Notes (Signed)
Chief Complaint: RUQ pain, bloating  Referring Provider:     Arlan Organ, MD    HPI:     Katherine Giles is a 40 y.o. female with a history of depression and GERD, referred to the Gastroenterology Clinic for evaluation of RUQ pain and bloating.  RUQ pain has been present for the last 5-6+ months or so.  Tends to be post prandial (within minutes) and occasionally with associated nausea (no emesis) and bloating. Not sure about relationship with types of foods. Pain tends to be constant thorugh the day, but worse after eating. No radiation. No history of elevated liver enzymes or known hepatobiliary or pancreatic disease.  No change in bowel habits, hematochezia, melana.   Has increased exercise and dietary mods recently.   - 04/2021: Normal CBC, CMP (protein 6.1), LDH - 08/2020: Abdominal ultrasound: Normal liver, GB.  CBD 2.1 mm.  Splenomegaly noted and stable from previous CT - 04/2020: CT chest: Spleen incompletely visualized but enlarged   She was recently seen by Dr. Myna Hidalgo for splenomegaly along with reactive lymphoid hyperplasia on right axillary biopsy.  CBC and peripheral smear otherwise unremarkable with plan for 70-month follow-up.  Hx of GERD, which is well controlled with Nexium 20 mg/day. Has had esophagram years ago- unsure of results. Worse with tomato-based sauces, spicy, soda, etc, which she avoids. No dysphagia.   No previous EGD or colonoscopy.  Past Medical History:  Diagnosis Date   Depression    GERD (gastroesophageal reflux disease)    Splenomegaly, not elsewhere classified 05/11/2020     Past Surgical History:  Procedure Laterality Date   AXILLARY LYMPH NODE BIOPSY Right 02/09/2020   Procedure: RIGHT TARGETED AXILLARY LYMPH NODE BIOPSY;  Surgeon: Manus Rudd, MD;  Location: [facility];  Service: General;  Laterality: Right;   DILATION AND CURETTAGE OF UTERUS     elective abortion   TONSILLECTOMY     Family History   Problem Relation Age of Onset   Heart disease Father    Cancer Paternal Aunt        cervical   Cancer Paternal Aunt        ovarian   Social History   Tobacco Use   Smoking status: Never   Smokeless tobacco: Never  Substance Use Topics   Alcohol use: Yes    Alcohol/week: 2.0 standard drinks    Types: 2 Glasses of wine per week    Comment: ocaas   Drug use: No   Current Outpatient Medications  Medication Sig Dispense Refill   calcium carbonate (TUMS - DOSED IN MG ELEMENTAL CALCIUM) 500 MG chewable tablet Chew 1 tablet by mouth daily as needed. For heartburn     esomeprazole (NEXIUM) 20 MG capsule Take 20 mg by mouth daily at 12 noon.     LORazepam (ATIVAN) 0.5 MG tablet Take 1 mg by mouth as needed.     medroxyPROGESTERone (PROVERA) 10 MG tablet Take 10 mg by mouth at bedtime.     Probiotic CAPS Take 1 capsule by mouth daily.     sertraline (ZOLOFT) 100 MG tablet Take 100 mg by mouth daily.     No current facility-administered medications for this visit.   Allergies  Allergen Reactions   Cephalexin Hives and Other (See Comments)   Atropine Sulfate Other (See Comments)   Meperidine Hcl Other (See Comments)   Atropine Nausea And Vomiting    Childhood reaction Childhood  reaction   Meperidine Nausea And Vomiting    Childhood reaction Childhood reaction     Review of Systems: All systems reviewed and negative except where noted in HPI.     Physical Exam:    Wt Readings from Last 3 Encounters:  04/30/21 256 lb (116.1 kg)  10/27/20 250 lb (113.4 kg)  07/31/20 247 lb (112 kg)    There were no vitals taken for this visit. Constitutional:  Pleasant, in no acute distress. Psychiatric: Normal mood and affect. Behavior is normal. EENT: Pupils normal.  Conjunctivae are normal. No scleral icterus. Neck supple. No cervical LAD. Cardiovascular: Normal rate, regular rhythm. No edema Pulmonary/chest: Effort normal and breath sounds normal. No wheezing, rales or  rhonchi. Abdominal: Mild TTP in RUQ and mid epigastrium.  No rebound or guarding.  No peritoneal signs.  Soft, nondistended. Bowel sounds active throughout. Neurological: Alert and oriented to person place and time. Skin: Skin is warm and dry. No rashes noted.   ASSESSMENT AND PLAN;   1) RUQ pain 2) Bloating - HIDA scan - EGD to evaluate for mucosal/luminal pathology  3) GERD - Well-controlled with Nexium 20 mg/day - Can evaluate for erosive esophagitis along with LES laxity, hiatal hernia, etc. time EGD as above - Continue antireflux lifestyle/dietary modifications  4) Splenomegaly - Follows with Dr. Myna Hidalgo in the Hematology clinic - Otherwise normal liver enzymes, no associated thrombocytopenia, and liver otherwise normal-appearing on ultrasound to suggest away from portal hypertension  The indications, risks, and benefits of EGD were explained to the patient in detail. Risks include but are not limited to bleeding, perforation, adverse reaction to medications, and cardiopulmonary compromise. Sequelae include but are not limited to the possibility of surgery, hospitalization, and mortality. The patient verbalized understanding and wished to proceed. All questions answered, referred to scheduler. Further recommendations pending results of the exam.     Shellia Cleverly, DO, FACG  05/09/2021, 10:21 AM   [name], MD

## 2021-05-09 NOTE — Patient Instructions (Signed)
If you are age 42 or older, your body mass index should be between 23-30. Your Body mass index is 43.77 kg/m. If this is out of the aforementioned range listed, please consider follow up with your Primary Care Provider.  If you are age 64 or younger, your body mass index should be between 19-25. Your Body mass index is 43.77 kg/m. If this is out of the aformentioned range listed, please consider follow up with your Primary Care Provider.   __________________________________________________________  The [facility] providers would like to encourage you to use [facility] to communicate with providers for non-urgent requests or questions.  Due to long hold times on the telephone, sending your provider a message by [facility] may be a faster and more efficient way to get a response.  Please allow 48 business hours for a response.  Please remember that this is for non-urgent requests.   You have been scheduled for a HIDA scan at [facility] (1st floor) on ____________._ Please arrive 15 minutes prior to your scheduled appointment at  __________. Make certain not to have anything to eat or drink at least 6 hours prior to your test. Should this appointment date or time not work well for you, please call radiology scheduling at [phone].  _____________________________________________________________________ hepatobiliary (HIDA) scan is an imaging procedure used to diagnose problems in the liver, gallbladder and bile ducts. In the HIDA scan, a radioactive chemical or tracer is injected into a vein in your arm. The tracer is handled by the liver like bile. Bile is a fluid produced and excreted by your liver that helps your digestive system break down fats in the foods you eat. Bile is stored in your gallbladder and the gallbladder releases the bile when you eat a meal. A special nuclear medicine scanner (gamma camera) tracks the flow of the tracer from your liver into your gallbladder and small intestine.   During your HIDA scan  You'll be asked to change into a hospital gown before your HIDA scan begins. Your health care team will position you on a table, usually on your back. The radioactive tracer is then injected into a vein in your arm.The tracer travels through your bloodstream to your liver, where it's taken up by the bile-producing cells. The radioactive tracer travels with the bile from your liver into your gallbladder and through your bile ducts to your small intestine.You may feel some pressure while the radioactive tracer is injected into your vein. As you lie on the table, a special gamma camera is positioned over your abdomen taking pictures of the tracer as it moves through your body. The gamma camera takes pictures continually for about an hour. You'll need to keep still during the HIDA scan. This can become uncomfortable, but you may find that you can lessen the discomfort by taking deep breaths and thinking about other things. Tell your health care team if you're uncomfortable. The radiologist will watch on a computer the progress of the radioactive tracer through your body. The HIDA scan may be stopped when the radioactive tracer is seen in the gallbladder and enters your small intestine. This typically takes about an hour. In some cases extra imaging will be performed if original images aren't satisfactory, if morphine is given to help visualize the gallbladder or if the medication CCK is given to look at the contraction of the gallbladder. This test typically takes 2 hours to complete. ________________________________________________________________________ Thank you for choosing me and [facility].  Vito Cirigliano, D.O.

## 2021-05-16 ENCOUNTER — Encounter: Payer: BC Managed Care – PPO | Admitting: Gastroenterology

## 2021-05-21 ENCOUNTER — Encounter: Payer: Self-pay | Admitting: Gastroenterology

## 2021-05-25 ENCOUNTER — Ambulatory Visit (HOSPITAL_COMMUNITY)
Admission: RE | Admit: 2021-05-25 | Discharge: 2021-05-25 | Disposition: A | Discharge status: Home / Self Care | Payer: BC Managed Care – PPO | Source: Ambulatory Visit | Attending: Gastroenterology | Admitting: Gastroenterology

## 2021-05-25 ENCOUNTER — Ambulatory Visit (HOSPITAL_COMMUNITY): Payer: BC Managed Care – PPO

## 2021-05-25 ENCOUNTER — Encounter (HOSPITAL_COMMUNITY): Payer: Self-pay

## 2021-05-25 DIAGNOSIS — R1011 Right upper quadrant pain: Secondary | ICD-10-CM | POA: Diagnosis not present

## 2021-05-25 DIAGNOSIS — R140 Abdominal distension (gaseous): Secondary | ICD-10-CM | POA: Diagnosis present

## 2021-05-25 DIAGNOSIS — K219 Gastro-esophageal reflux disease without esophagitis: Secondary | ICD-10-CM

## 2021-05-25 MED ORDER — TECHNETIUM TC 99M MEBROFENIN IV KIT
5.3000 | PACK | Freq: Once | INTRAVENOUS | Status: AC
  Administered 2021-05-25: 5.3 via INTRAVENOUS

## 2021-06-01 ENCOUNTER — Encounter: Payer: BC Managed Care – PPO | Admitting: Gastroenterology

## 2021-06-27 ENCOUNTER — Encounter: Payer: Self-pay | Admitting: Certified Registered Nurse Anesthetist

## 2021-06-28 ENCOUNTER — Encounter: Payer: Self-pay | Admitting: Gastroenterology

## 2021-06-28 ENCOUNTER — Ambulatory Visit (AMBULATORY_SURGERY_CENTER): Payer: BC Managed Care – PPO | Admitting: Gastroenterology

## 2021-06-28 ENCOUNTER — Other Ambulatory Visit: Payer: Self-pay

## 2021-06-28 VITALS — BP 125/70 | HR 67 | Temp 96.9°F | Resp 15 | Ht 64.0 in | Wt 255.0 lb

## 2021-06-28 DIAGNOSIS — K2970 Gastritis, unspecified, without bleeding: Secondary | ICD-10-CM

## 2021-06-28 DIAGNOSIS — K2990 Gastroduodenitis, unspecified, without bleeding: Secondary | ICD-10-CM

## 2021-06-28 DIAGNOSIS — K317 Polyp of stomach and duodenum: Secondary | ICD-10-CM

## 2021-06-28 DIAGNOSIS — R140 Abdominal distension (gaseous): Secondary | ICD-10-CM | POA: Diagnosis not present

## 2021-06-28 DIAGNOSIS — K219 Gastro-esophageal reflux disease without esophagitis: Secondary | ICD-10-CM

## 2021-06-28 DIAGNOSIS — R1011 Right upper quadrant pain: Secondary | ICD-10-CM | POA: Diagnosis not present

## 2021-06-28 MED ORDER — SODIUM CHLORIDE 0.9 % IV SOLN: 500.0000 mL | Freq: Once | INTRAVENOUS | Status: DC

## 2021-06-28 NOTE — Progress Notes (Signed)
GASTROENTEROLOGY PROCEDURE H&P NOTE   Primary Care Physician: Lyndel Safe., MD    Reason for Procedure:  RUQ pain, abdominal bloating, nausea without emesis, GERD  Plan:    EGD  Patient is appropriate for endoscopic procedure(s) in the ambulatory ([facility]) setting.  The nature of the procedure, as well as the risks, benefits, and alternatives were carefully and thoroughly reviewed with the patient. Ample time for discussion and questions allowed. The patient understood, was satisfied, and agreed to proceed.     HPI: Katherine Giles is a 41 y.o. female who presents for EGD for evaluation of RUQ pain, abdominal bloating, nausea without emesis, and a longstanding history of GERD.  Past Medical History:  Diagnosis Date   Depression    GERD (gastroesophageal reflux disease)    Splenomegaly, not elsewhere classified 05/11/2020    Past Surgical History:  Procedure Laterality Date   AXILLARY LYMPH NODE BIOPSY Right 02/09/2020   Procedure: RIGHT TARGETED AXILLARY LYMPH NODE BIOPSY;  Surgeon: Manus Rudd, MD;  Location: [facility];  Service: General;  Laterality: Right;   DILATION AND CURETTAGE OF UTERUS     elective abortion   TONSILLECTOMY      Prior to Admission medications   Medication Sig Start Date End Date Taking? Authorizing Provider  esomeprazole (NEXIUM) 20 MG capsule Take 20 mg by mouth daily at 12 noon.   Yes Alver Fisher, RN  Probiotic CAPS Take 1 capsule by mouth daily.   Yes [provider]  sertraline (ZOLOFT) 100 MG tablet Take 100 mg by mouth daily. 04/10/16  Yes [provider]  calcium carbonate (TUMS - DOSED IN MG ELEMENTAL CALCIUM) 500 MG chewable tablet Chew 1 tablet by mouth daily as needed. For heartburn    [provider]  LORazepam (ATIVAN) 0.5 MG tablet Take 1 mg by mouth as needed.    [provider]    Current Outpatient Medications  Medication Sig Dispense Refill   esomeprazole (NEXIUM)  20 MG capsule Take 20 mg by mouth daily at 12 noon.     Probiotic CAPS Take 1 capsule by mouth daily.     sertraline (ZOLOFT) 100 MG tablet Take 100 mg by mouth daily.     calcium carbonate (TUMS - DOSED IN MG ELEMENTAL CALCIUM) 500 MG chewable tablet Chew 1 tablet by mouth daily as needed. For heartburn     LORazepam (ATIVAN) 0.5 MG tablet Take 1 mg by mouth as needed.     Current Facility-Administered Medications  Medication Dose Route Frequency Provider Last Rate Last Admin   0.9 %  sodium chloride infusion  500 mL Intravenous Once Avangeline Stockburger V, DO        Allergies as of 06/28/2021 - Review Complete 06/28/2021  Allergen Reaction Noted   Cephalexin Hives and Other (See Comments) 05/20/2012   Atropine sulfate Other (See Comments) 05/17/2020   Meperidine hcl Other (See Comments) 05/17/2020   Atropine Nausea And Vomiting 05/20/2012   Meperidine Nausea And Vomiting 05/20/2012    Family History  Problem Relation Age of Onset   Heart disease Father    Cancer Paternal Aunt        cervical   Cancer Paternal Aunt        ovarian   Colon cancer Neg Hx    Esophageal cancer Neg Hx    Rectal cancer Neg Hx    Stomach cancer Neg Hx     Social History   Socioeconomic History   Marital  status: Married    Spouse name: Not on file   Number of children: Not on file   Years of education: Not on file   Highest education level: Not on file  Occupational History   Not on file  Tobacco Use   Smoking status: Never   Smokeless tobacco: Never  Vaping Use   Vaping Use: Never used  Substance and Sexual Activity   Alcohol use: Yes    Alcohol/week: 2.0 standard drinks    Types: 2 Glasses of wine per week    Comment: ocaas   Drug use: No   Sexual activity: Yes    Birth control/protection: None  Other Topics Concern   Not on file  Social History Narrative   Not on file   Social Determinants of Health   Financial Resource Strain: Not on file  Food Insecurity: Not on file   Transportation Needs: Not on file  Physical Activity: Not on file  Stress: Not on file  Social Connections: Not on file  Intimate Partner Violence: Not on file    Physical Exam: Vital signs in last 24 hours: @BP  133/79    Pulse 77    Temp (!) 96.9 F (36.1 C) (Temporal)    Ht 5\' 4"  (1.626 m)    Wt 255 lb (115.7 kg)    SpO2 99%    BMI 43.77 kg/m  GEN: NAD EYE: Sclerae anicteric ENT: MMM CV: Non-tachycardic Pulm: CTA b/l GI: Soft, NT/ND NEURO:  Alert & Oriented x 3   , DO [facility]   06/28/2021 10:17 AM

## 2021-06-28 NOTE — Op Note (Signed)
[facility] Patient Name: Katherine Giles Procedure Date: 06/28/2021 10:07 AM MRN: [id number] Endoscopist: Doristine Locks , MD Age: 41 Referring MD:  Date of Birth: 1981/01/18 Gender: Female Account #: [id number] Procedure:                Upper GI endoscopy Indications:              Abdominal pain in the right upper quadrant,                            Suspected esophageal reflux, Abdominal bloating,                            Nausea                           HIDA scan was normal. Medicines:                Monitored Anesthesia Care Procedure:                Pre-Anesthesia Assessment:                           - Prior to the procedure, a History and Physical                            was performed, and patient medications and                            allergies were reviewed. The patient's tolerance of                            previous anesthesia was also reviewed. The risks                            and benefits of the procedure and the sedation                            options and risks were discussed with the patient.                            All questions were answered, and informed consent                            was obtained. Prior Anticoagulants: The patient has                            taken no previous anticoagulant or antiplatelet                            agents. ASA Grade Assessment: III - A patient with                            severe systemic disease. After reviewing the risks  and benefits, the patient was deemed in                            satisfactory condition to undergo the procedure.                           After obtaining informed consent, the endoscope was                            passed under direct vision. Throughout the                            procedure, the patient's blood pressure, pulse, and                            oxygen saturations were monitored continuously. The                             GIF [id number] #[id number] was introduced through the                            mouth, and advanced to the second part of duodenum.                            The upper GI endoscopy was accomplished without                            difficulty. The patient tolerated the procedure                            well. Scope In: Scope Out: Findings:                 The examined esophagus was normal.                           Mild inflammation characterized by congestion                            (edema) and erythema was found in the gastric body.                            Biopsies were taken with a cold forceps for                            Helicobacter pylori testing. Estimated blood loss                            was minimal.                           A single 8 mm sessile polyp with no bleeding and no                            stigmata of recent bleeding was found in  the                            gastric antrum. The polyp was removed with a cold                            snare. Resection and retrieval were complete.                            Estimated blood loss was minimal.                           The gastroesophageal flap valve was visualized                            endoscopically and classified as Hill Grade III                            (minimal fold, loose to endoscope, hiatal hernia                            likely).                           The examined duodenum was normal. Biopsies were                            taken with a cold forceps for histology. Estimated                            blood loss was minimal. Complications:            No immediate complications. Estimated Blood Loss:     Estimated blood loss was minimal. Impression:               - Normal esophagus.                           - Gastritis. Biopsied.                           - A single gastric polyp. Resected and retrieved.                           - Gastroesophageal flap valve classified as Hill                             Grade III (minimal fold, loose to endoscope, hiatal                            hernia likely).                           - Normal examined duodenum. Biopsied. Recommendation:           - Patient has a contact number available for  emergencies. The signs and symptoms of potential                            delayed complications were discussed with the                            patient. Return to normal activities tomorrow.                            Written discharge instructions were provided to the                            patient.                           - Resume previous diet.                           - Continue present medications.                           - Trial course of OTC FD Guard.                           - Await pathology results.                           - Return to GI clinic at appointment to be                            scheduled. Doristine Locks, MD 06/28/2021 10:46:50 AM

## 2021-06-28 NOTE — Progress Notes (Signed)
1033  Pt experienced laryngeal spasm with jaw thrust and PPV performed. vss

## 2021-06-28 NOTE — Progress Notes (Signed)
Called to room to assist during endoscopic procedure.  Patient ID and intended procedure confirmed with present staff. Received instructions for my participation in the procedure from the performing physician.  

## 2021-06-28 NOTE — Progress Notes (Signed)
1033 Robinul 0.1 mg IV given due large amount of secretions upon assessment.  MD made aware, vss 

## 2021-06-28 NOTE — Patient Instructions (Signed)
Resume previous medications.  Await results for final recommendations.  Handouts on findings given to patient.     Trial course of OTC FD guard. Take per instructions.  Follow up office appointment to be scheduled by Dr. Frankey Shown office.   YOU HAD AN ENDOSCOPIC PROCEDURE TODAY AT THE [facility]:   Refer to the procedure report that was given to you for any specific questions about what was found during the examination.  If the procedure report does not answer your questions, please call your gastroenterologist to clarify.  If you requested that your care partner not be given the details of your procedure findings, then the procedure report has been included in a sealed envelope for you to review at your convenience later.  YOU SHOULD EXPECT: Some feelings of bloating in the abdomen. Passage of more gas than usual.  Walking can help get rid of the air that was put into your GI tract during the procedure and reduce the bloating. If you had a lower endoscopy (such as a colonoscopy or flexible sigmoidoscopy) you may notice spotting of blood in your stool or on the toilet paper. If you underwent a bowel prep for your procedure, you may not have a normal bowel movement for a few days.  Please Note:  You might notice some irritation and congestion in your nose or some drainage.  This is from the oxygen used during your procedure.  There is no need for concern and it should clear up in a day or so.  SYMPTOMS TO REPORT IMMEDIATELY:   Following upper endoscopy (EGD)  Vomiting of blood or coffee ground material  New chest pain or pain under the shoulder blades  Painful or persistently difficult swallowing  New shortness of breath  Fever of 100F or higher  Black, tarry-looking stools  For urgent or emergent issues, a gastroenterologist can be reached at any hour by calling [phone]. Do not use MyChart messaging for urgent concerns.    DIET:  We do recommend a small meal at  first, but then you may proceed to your regular diet.  Drink plenty of fluids but you should avoid alcoholic beverages for 24 hours.  ACTIVITY:  You should plan to take it easy for the rest of today and you should NOT DRIVE or use heavy machinery until tomorrow (because of the sedation medicines used during the test).    FOLLOW UP: Our staff will call the number listed on your records 48-72 hours following your procedure to check on you and address any questions or concerns that you may have regarding the information given to you following your procedure. If we do not reach you, we will leave a message.  We will attempt to reach you two times.  During this call, we will ask if you have developed any symptoms of COVID 19. If you develop any symptoms (ie: fever, flu-like symptoms, shortness of breath, cough etc.) before then, please call [phone].  If you test positive for Covid 19 in the 2 weeks post procedure, please call and report this information to [place].    If any biopsies were taken you will be contacted by phone or by letter within the next 1-3 weeks.  Please call us at [phone] if you have not heard about the biopsies in 3 weeks.    SIGNATURES/CONFIDENTIALITY: You and/or your care partner have signed paperwork which will be entered into your electronic medical record.  These signatures attest to the fact that that the  information above on your After Visit Summary has been reviewed and is understood.  Full responsibility of the confidentiality of this discharge information lies with you and/or your care-partner.

## 2021-06-28 NOTE — Progress Notes (Signed)
Report given to PACU, vss 

## 2021-06-28 NOTE — Progress Notes (Signed)
Pt's states no medical or surgical changes since previsit or office visit. 

## 2021-06-29 ENCOUNTER — Telehealth: Payer: Self-pay

## 2021-06-29 NOTE — Telephone Encounter (Signed)
Per 06/28/21 procedure report - Return to GI clinic at appt to be scheduled.  Left message on pt's vm for her to return call to schedule a f/u appt with Dr. Bryan Lemma.

## 2021-07-02 ENCOUNTER — Telehealth: Payer: Self-pay

## 2021-07-02 NOTE — Telephone Encounter (Signed)
Pt returned call, she was scheduled for a f/u up with Dr. Barron Alvine on Wednesday, 07/25/21 at 3:20 pm.

## 2021-07-02 NOTE — Telephone Encounter (Signed)
Left message on follow up call. 

## 2021-07-02 NOTE — Telephone Encounter (Signed)
Second attempt follow up call to pt, LM on VM ?

## 2021-07-06 ENCOUNTER — Encounter: Payer: Self-pay | Admitting: Gastroenterology

## 2021-07-11 ENCOUNTER — Encounter (HOSPITAL_COMMUNITY): Payer: Self-pay

## 2021-07-25 ENCOUNTER — Other Ambulatory Visit: Payer: Self-pay

## 2021-07-25 ENCOUNTER — Ambulatory Visit: Payer: BC Managed Care – PPO | Admitting: Gastroenterology

## 2021-07-25 ENCOUNTER — Encounter: Payer: Self-pay | Admitting: Gastroenterology

## 2021-07-25 VITALS — BP 118/78 | HR 62 | Ht 64.0 in | Wt 252.0 lb

## 2021-07-25 DIAGNOSIS — K219 Gastro-esophageal reflux disease without esophagitis: Secondary | ICD-10-CM | POA: Diagnosis not present

## 2021-07-25 DIAGNOSIS — R140 Abdominal distension (gaseous): Secondary | ICD-10-CM | POA: Diagnosis not present

## 2021-07-25 NOTE — Patient Instructions (Signed)
If you are age 43 or older, your body mass index should be between 23-30. Your Body mass index is 43.26 kg/m. If this is out of the aforementioned range listed, please consider follow up with your Primary Care Provider.  If you are age 17 or younger, your body mass index should be between 19-25. Your Body mass index is 43.26 kg/m. If this is out of the aformentioned range listed, please consider follow up with your Primary Care Provider.   __________________________________________________________  The [facility] providers would like to encourage you to use [facility] to communicate with providers for non-urgent requests or questions.  Due to long hold times on the telephone, sending your provider a message by [facility] may be a faster and more efficient way to get a response.  Please allow 48 business hours for a response.  Please remember that this is for non-urgent requests.   Follow up as needed  Thank you for choosing me and [facility].  Vito Cirigliano, D.O.

## 2021-07-25 NOTE — Progress Notes (Signed)
° °  Chief Complaint:    RUQ pain, bloating, procedure follow-up  GI History: 41 year old with a history of depression and GERD, initially seen in the GI clinic 05/09/2021 for evaluation of RUQ pain and bloating.  1) RUQ pain.  Present since approximately 11/2020. Tends to be post prandial (within minutes) and occasionally with associated nausea (no emesis) and bloating. Pain tends to be constant thorugh the day, but worse after eating. No radiation. - 04/2020: CT chest: Spleen incompletely visualized but enlarged - 08/2020: Abdominal ultrasound: Normal liver, GB.  CBD 2.1 mm.  Splenomegaly noted and stable from previous CT - 04/2021: Normal CBC, CMP (protein 6.1), LDH - 04/2021: Evaluated by Dr. Myna Hidalgo for splenomegaly and reactive lymphoid hyperplasia on right axillary biopsy.  CBC and peripheral smear unremarkable.  Plan for 79-month follow-up. - 04/2021: Initial GI evaluation. - 05/2021: HIDA normal - 06/2021: EGD: Normal esophagus, 8 mm polyp in antrum removed with cold snare (path: Benign), normal stomach, Hill grade 3 valve.  Normal duodenum (path benign).  Started FD guard  2) GERD: Index symptoms of heartburn and regurgitation. Has had esophagram years ago- unsure of results. Worse with tomato-based sauces, spicy, soda, etc, which she avoids.  Generally well controlled with Nexium 20 mg/day.  HPI:     Patient is a 41 y.o. female presenting to the Gastroenterology Clinic for follow-up.  Initially seen by me in 04/2021 for evaluation of RUQ pain and abdominal bloating.  Has had largely unremarkable work-up as outlined above.  Completed EGD last month which was largely unremarkable as above.  Recommended starting OTC FD guard.  Today, Mykala states symptoms are much improved since starting FD guard. Decreased bloating and improved appetite.   Reflux otherwise well controlled Nexium 20 mg/day.    Review of systems:     No chest pain, no SOB, no fevers, no urinary sx   Past Medical  History:  Diagnosis Date   Depression    GERD (gastroesophageal reflux disease)    Splenomegaly, not elsewhere classified 05/11/2020    Patient's surgical history, family medical history, social history, medications and allergies were all reviewed in Epic    Current Outpatient Medications  Medication Sig Dispense Refill   calcium carbonate (TUMS - DOSED IN MG ELEMENTAL CALCIUM) 500 MG chewable tablet Chew 1 tablet by mouth daily as needed. For heartburn     esomeprazole (NEXIUM) 20 MG capsule Take 20 mg by mouth daily at 12 noon.     LORazepam (ATIVAN) 0.5 MG tablet Take 1 mg by mouth as needed.     Probiotic CAPS Take 1 capsule by mouth daily.     sertraline (ZOLOFT) 100 MG tablet Take 100 mg by mouth daily.     No current facility-administered medications for this visit.    Physical Exam:     There were no vitals taken for this visit.  GENERAL:  Pleasant female in NAD PSYCH: : Cooperative, normal affect NEURO: Alert and oriented x 3, no focal neurologic deficits   IMPRESSION and PLAN:    1) Abdominal bloating -Low FODMAP diet.  Provided with handout and detailed instructions today -Resume FDGard  2) GERD -Continue Nexium as prescribed - Continue antireflux lifestyle/dietary modifications with avoidance of exacerbating foods   RTC as needed           Shellia Cleverly ,DO, FACG 07/25/2021, 3:27 PM

## 2021-10-29 ENCOUNTER — Encounter: Payer: Self-pay | Admitting: Hematology & Oncology

## 2021-10-29 ENCOUNTER — Inpatient Hospital Stay (HOSPITAL_BASED_OUTPATIENT_CLINIC_OR_DEPARTMENT_OTHER): Payer: BC Managed Care – PPO | Admitting: Hematology & Oncology

## 2021-10-29 ENCOUNTER — Inpatient Hospital Stay: Payer: BC Managed Care – PPO | Attending: Hematology & Oncology

## 2021-10-29 VITALS — BP 132/94 | HR 86 | Temp 99.3°F | Resp 20 | Wt 245.0 lb

## 2021-10-29 DIAGNOSIS — R590 Localized enlarged lymph nodes: Secondary | ICD-10-CM | POA: Insufficient documentation

## 2021-10-29 DIAGNOSIS — R161 Splenomegaly, not elsewhere classified: Secondary | ICD-10-CM | POA: Insufficient documentation

## 2021-10-29 DIAGNOSIS — R1011 Right upper quadrant pain: Secondary | ICD-10-CM

## 2021-10-29 LAB — CBC WITH DIFFERENTIAL (CANCER CENTER ONLY)
Abs Immature Granulocytes: 0.05 10*3/uL (ref 0.00–0.07)
Basophils Absolute: 0 10*3/uL (ref 0.0–0.1)
Basophils Relative: 0 %
Eosinophils Absolute: 0.1 10*3/uL (ref 0.0–0.5)
Eosinophils Relative: 1 %
HCT: 43.5 % (ref 36.0–46.0)
Hemoglobin: 14.4 g/dL (ref 12.0–15.0)
Immature Granulocytes: 1 %
Lymphocytes Relative: 29 %
Lymphs Abs: 2.1 10*3/uL (ref 0.7–4.0)
MCH: 26.6 pg (ref 26.0–34.0)
MCHC: 33.1 g/dL (ref 30.0–36.0)
MCV: 80.4 fL (ref 80.0–100.0)
Monocytes Absolute: 0.4 10*3/uL (ref 0.1–1.0)
Monocytes Relative: 5 %
Neutro Abs: 4.6 10*3/uL (ref 1.7–7.7)
Neutrophils Relative %: 64 %
Platelet Count: 155 10*3/uL (ref 150–400)
RBC: 5.41 MIL/uL — ABNORMAL HIGH (ref 3.87–5.11)
RDW: 12.9 % (ref 11.5–15.5)
WBC Count: 7.2 10*3/uL (ref 4.0–10.5)
nRBC: 0 % (ref 0.0–0.2)

## 2021-10-29 LAB — CMP (CANCER CENTER ONLY)
ALT: 18 U/L (ref 0–44)
AST: 17 U/L (ref 15–41)
Albumin: 4.1 g/dL (ref 3.5–5.0)
Alkaline Phosphatase: 75 U/L (ref 38–126)
Anion gap: 7 (ref 5–15)
BUN: 14 mg/dL (ref 6–20)
CO2: 28 mmol/L (ref 22–32)
Calcium: 9.3 mg/dL (ref 8.9–10.3)
Chloride: 103 mmol/L (ref 98–111)
Creatinine: 0.92 mg/dL (ref 0.44–1.00)
GFR, Estimated: >60 mL/min (ref >60)
Glucose, Bld: 121 mg/dL — ABNORMAL HIGH (ref 70–99)
Potassium: 4.8 mmol/L (ref 3.5–5.1)
Sodium: 138 mmol/L (ref 135–145)
Total Bilirubin: 0.1 mg/dL — ABNORMAL LOW (ref 0.3–1.2)
Total Protein: 6.8 g/dL (ref 6.5–8.1)

## 2021-10-29 LAB — LACTATE DEHYDROGENASE: LDH: 150 U/L (ref 98–192)

## 2021-10-29 LAB — SAVE SMEAR(SSMR), FOR PROVIDER SLIDE REVIEW

## 2021-10-29 NOTE — Progress Notes (Signed)
?Hematology and Oncology Follow Up Visit ? ?Memory Argue ?[id number] ?[birth year] 41 y.o. ?10/29/2021 ? ? 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 ?Current Therapy:   ?Observation ?    ?Interim History:  Ms. Gunnoe is back for follow-up.  We see her every 6 months.  Since we last saw her, she been doing well.  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  She is finished for the year.  Hopefully she will have a wonderful summer. ? ?Since we last saw her, she has undergone an upper endoscopy.  This really did not show anything that was obvious.  There may have been some stomach lining swelling.  Dr. Barron Alvine has recommended some adjustments to her diet. ? ?She has had no fever.  She has had no nausea or vomiting.  She has had no change in bowel or bladder habits. ? ?We have probably not done a ultrasound of her spleen for about a year.  I think we have to recheck this.  We will do this when we see her back. ? ?She has had no problems with COVID.  She has had no rashes. ? ?Overall, I would have to say that her performance status is ECOG 0.   ? ?Medications:  ?Current Outpatient Medications:  ?  calcium carbonate (TUMS - DOSED IN MG ELEMENTAL CALCIUM) 500 MG chewable tablet, Chew 1 tablet by mouth daily as needed. For heartburn, Disp: , Rfl:  ?  clotrimazole-betamethasone (LOTRISONE) cream, Apply 1 application. topically 2 (two) times daily., Disp: , Rfl:  ?  esomeprazole (NEXIUM) 20 MG capsule, Take 20 mg by mouth daily at 12 noon., Disp: , Rfl:  ?  LORazepam (ATIVAN) 0.5 MG tablet, Take 1 mg by mouth daily as needed., Disp: , Rfl:  ?  OZEMPIC, 0.25 OR 0.5 MG/DOSE, 2 MG/1.5ML SOPN, 0.5 mg once a week., Disp: , Rfl:  ?  Probiotic CAPS, Take 1 capsule by mouth daily., Disp: , Rfl:  ?  sertraline (ZOLOFT) 100 MG tablet, Take 100 mg by mouth daily., Disp: , Rfl:  ? ?Allergies:  ?Allergies  ?Allergen Reactions  ? Cephalexin Hives and Other  (See Comments)  ? Atropine Sulfate Other (See Comments)  ? Meperidine Hcl Other (See Comments)  ? Atropine Nausea And Vomiting  ?  Childhood reaction ?Childhood reaction  ? Meperidine Nausea And Vomiting  ?  Childhood reaction ?Childhood reaction  ? ? ?Past Medical History, Surgical history, Social history, and Family History were reviewed and updated. ? ?Review of Systems: ?Review of Systems  ?Constitutional: Negative.   ?HENT:  Negative.    ?Eyes: Negative.   ?Respiratory: Negative.    ?Cardiovascular: Negative.   ?Gastrointestinal: Negative.   ?Endocrine: Negative.   ?Genitourinary: Negative.    ?Musculoskeletal: Negative.   ?Skin: Negative.   ?Neurological: Negative.   ?Hematological: Negative.   ?Psychiatric/Behavioral: Negative.    ? ?Physical Exam: ? weight is 245 lb (111.1 kg). Her oral temperature is 99.3 ?F (37.4 ?C). Her blood pressure is 132/94 (abnormal) and her pulse is 86. Her respiration is 20 and oxygen saturation is 100%.  ? ?Wt Readings from Last 3 Encounters:  ?10/29/21 245 lb (111.1 kg)  ?07/25/21 252 lb (114.3 kg)  ?06/28/21 255 lb (115.7 kg)  ? ? ?Physical Exam ?Vitals reviewed.  ?HENT:  ?   Head: Normocephalic and atraumatic.  ?Eyes:  ?   Pupils: Pupils are equal, round, and reactive to light.  ?  Neck:  ?   Comments: I cannot palpate any adenopathy in the cervical, supraclavicular or axillary regions. ?Cardiovascular:  ?   Rate and Rhythm: Normal rate and regular rhythm.  ?   Heart sounds: Normal heart sounds.  ?Pulmonary:  ?   Effort: Pulmonary effort is normal.  ?   Breath sounds: Normal breath sounds.  ?Abdominal:  ?   General: Bowel sounds are normal.  ?   Palpations: Abdomen is soft.  ?   Comments: Abdominal exam is soft.  She has no fluid wave.  There is no guarding or rebound tenderness.  I really cannot palpate her liver or spleen.  It is possible of the spleen tip might be palpable deep inspiration just below the left costal margin.  ?Musculoskeletal:     ?   General: No tenderness or  deformity. Normal range of motion.  ?   Cervical back: Normal range of motion.  ?Lymphadenopathy:  ?   Cervical: No cervical adenopathy.  ?Skin: ?   General: Skin is warm and dry.  ?   Findings: No erythema or rash.  ?Neurological:  ?   Mental Status: She is alert and oriented to person, place, and time.  ?Psychiatric:     ?   Behavior: Behavior normal.     ?   Thought Content: Thought content normal.     ?   Judgment: Judgment normal.  ? ?Lab Results  ?Component Value Date  ? WBC 7.2 10/29/2021  ? HGB 14.4 10/29/2021  ? HCT 43.5 10/29/2021  ? MCV 80.4 10/29/2021  ? PLT 155 10/29/2021  ? ?  Chemistry   ?   ?Component Value Date/Time  ? NA 138 10/29/2021 1445  ? K 4.8 10/29/2021 1445  ? CL 103 10/29/2021 1445  ? CO2 28 10/29/2021 1445  ? BUN 14 10/29/2021 1445  ? CREATININE 0.92 10/29/2021 1445  ?    ?Component Value Date/Time  ? CALCIUM 9.3 10/29/2021 1445  ? ALKPHOS 75 10/29/2021 1445  ? AST 17 10/29/2021 1445  ? ALT 18 10/29/2021 1445  ? BILITOT 0.1 (L) 10/29/2021 1445  ?  ? ? ?Impression and Plan: ?Ms. Belleville is is a very nice 41 year old Caucasian female.  She was found to have reactive lymphoid hyperplasia.  This was in a right axillary biopsy. ? ? ?So far, we have not found anything that looks like malignancy.  I suppose that this reactive lymph node could have been from the COVID-vaccine that she could have had. ? ?I still would want to check out her spleen.  Will be interesting to see if her spleen has changed in size. ? ?I think we can get her back in 6 months.  I think if everything looks stable in 6 months, then maybe we can let her go from the clinic.  We really are not doing much for her right now.  She is always totally aware of what is going on and she would certainly let us know if she had any problems. ? ? ? ?Josph Macho, MD ?5/8/20233:28 PM ?

## 2022-05-01 ENCOUNTER — Other Ambulatory Visit: Payer: Self-pay

## 2022-05-01 ENCOUNTER — Encounter: Payer: Self-pay | Admitting: Hematology & Oncology

## 2022-05-01 ENCOUNTER — Inpatient Hospital Stay: Payer: BC Managed Care – PPO | Admitting: Hematology & Oncology

## 2022-05-01 ENCOUNTER — Inpatient Hospital Stay: Payer: BC Managed Care – PPO | Attending: Hematology & Oncology

## 2022-05-01 VITALS — BP 127/67 | HR 92 | Temp 99.2°F | Resp 18 | Wt 232.0 lb

## 2022-05-01 DIAGNOSIS — R161 Splenomegaly, not elsewhere classified: Secondary | ICD-10-CM | POA: Insufficient documentation

## 2022-05-01 DIAGNOSIS — R590 Localized enlarged lymph nodes: Secondary | ICD-10-CM | POA: Insufficient documentation

## 2022-05-01 LAB — CBC WITH DIFFERENTIAL (CANCER CENTER ONLY)
Abs Immature Granulocytes: 0.04 10*3/uL (ref 0.00–0.07)
Basophils Absolute: 0 10*3/uL (ref 0.0–0.1)
Basophils Relative: 0 %
Eosinophils Absolute: 0 10*3/uL (ref 0.0–0.5)
Eosinophils Relative: 1 %
HCT: 42.9 % (ref 36.0–46.0)
Hemoglobin: 13.9 g/dL (ref 12.0–15.0)
Immature Granulocytes: 1 %
Lymphocytes Relative: 11 %
Lymphs Abs: 0.6 10*3/uL — ABNORMAL LOW (ref 0.7–4.0)
MCH: 25.9 pg — ABNORMAL LOW (ref 26.0–34.0)
MCHC: 32.4 g/dL (ref 30.0–36.0)
MCV: 80 fL (ref 80.0–100.0)
Monocytes Absolute: 0.4 10*3/uL (ref 0.1–1.0)
Monocytes Relative: 7 %
Neutro Abs: 4.7 10*3/uL (ref 1.7–7.7)
Neutrophils Relative %: 80 %
Platelet Count: 135 10*3/uL — ABNORMAL LOW (ref 150–400)
RBC: 5.36 MIL/uL — ABNORMAL HIGH (ref 3.87–5.11)
RDW: 12.8 % (ref 11.5–15.5)
WBC Count: 5.8 10*3/uL (ref 4.0–10.5)
nRBC: 0 % (ref 0.0–0.2)

## 2022-05-01 LAB — LACTATE DEHYDROGENASE: LDH: 156 U/L (ref 98–192)

## 2022-05-01 LAB — SAVE SMEAR(SSMR), FOR PROVIDER SLIDE REVIEW

## 2022-05-01 LAB — CMP (CANCER CENTER ONLY)
ALT: 12 U/L (ref 0–44)
AST: <5 U/L — ABNORMAL LOW (ref 15–41)
Albumin: 4.4 g/dL (ref 3.5–5.0)
Alkaline Phosphatase: 73 U/L (ref 38–126)
Anion gap: 7 (ref 5–15)
BUN: 12 mg/dL (ref 6–20)
CO2: 29 mmol/L (ref 22–32)
Calcium: 9.8 mg/dL (ref 8.9–10.3)
Chloride: 103 mmol/L (ref 98–111)
Creatinine: 0.86 mg/dL (ref 0.44–1.00)
GFR, Estimated: >60 mL/min (ref >60)
Glucose, Bld: 121 mg/dL — ABNORMAL HIGH (ref 70–99)
Potassium: 4.7 mmol/L (ref 3.5–5.1)
Sodium: 139 mmol/L (ref 135–145)
Total Bilirubin: 0.4 mg/dL (ref 0.3–1.2)
Total Protein: 6.7 g/dL (ref 6.5–8.1)

## 2022-05-01 NOTE — Progress Notes (Signed)
Hematology and Oncology Follow Up Visit  Katherine Giles [id number] 1980/08/31 41 y.o. 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  We see her every 6 months.  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  I must say that she is quite busy.  Unfortunate, she did not have the splenic ultrasound that we wanted.  For some reason, she was never called by the facility down in [place].  She has had no problem with fever.  There is been no issues with infections.  She has had no change in bowel or bladder habits.  She has had no rashes.  There is been no leg swelling.  She and her wife had a wonderful summer.  They were down in [place] and had a wonderful time.  She still has her monthly cycles.  She says that there may be a little bit more painful.  They are not heavy.  Currently, I would have said that her performance status is ECOG 0.    Medications:  Current Outpatient Medications:    hydroquinone 4 % cream, Apply topically 2 (two) times daily., Disp: , Rfl:    tretinoin (RETIN-A) 0.05 % cream, Apply topically at bedtime., Disp: , Rfl:    triamcinolone cream (KENALOG) 0.1 %, Apply 1 Application topically., Disp: , Rfl:    WEGOVY 1.7 MG/0.75ML SOAJ, Inject 1.7 mg into the skin once a week., Disp: , Rfl:    ALPRAZolam (XANAX PO), , Disp: , Rfl:    calcium carbonate (TUMS - DOSED IN MG ELEMENTAL CALCIUM) 500 MG chewable tablet, Chew 1 tablet by mouth daily as needed. For heartburn, Disp: , Rfl:    clotrimazole-betamethasone (LOTRISONE) cream, Apply 1 application. topically 2 (two) times daily., Disp: , Rfl:    esomeprazole (NEXIUM) 40 MG capsule, Take 40 mg by mouth daily at 12 noon., Disp: , Rfl:    LORazepam (ATIVAN) 0.5 MG tablet, Take 1 mg by mouth daily as needed., Disp: , Rfl:     medroxyPROGESTERone (PROVERA) 10 MG tablet, Take 10 mg by mouth daily., Disp: , Rfl:    Probiotic CAPS, Take 1 capsule by mouth daily., Disp: , Rfl:    sertraline (ZOLOFT) 100 MG tablet, Take 100 mg by mouth daily., Disp: , Rfl:   Allergies:  Allergies  Allergen Reactions   Cephalexin Hives and Other (See Comments)   Atropine Sulfate Other (See Comments)   Meperidine Hcl Other (See Comments)   Atropine Nausea And Vomiting    Childhood reaction Childhood reaction   Meperidine Nausea And Vomiting    Childhood reaction Childhood reaction    Past Medical History, Surgical history, Social history, and Family History were reviewed and updated.  Review of Systems: Review of Systems  Constitutional: Negative.   HENT:  Negative.    Eyes: Negative.   Respiratory: Negative.    Cardiovascular: Negative.   Gastrointestinal: Negative.   Endocrine: Negative.   Genitourinary: Negative.    Musculoskeletal: Negative.   Skin: Negative.   Neurological: Negative.   Hematological: Negative.   Psychiatric/Behavioral: Negative.      Physical Exam:  weight is 232 lb (105.2 kg). Her oral temperature is 99.2 F (37.3 C). Her blood pressure is 127/67 and her pulse is 92. Her respiration is 18 and oxygen saturation is 100%.   Wt Readings from  Last 3 Encounters:  05/01/22 232 lb (105.2 kg)  10/29/21 245 lb (111.1 kg)  07/25/21 252 lb (114.3 kg)    Physical Exam Vitals reviewed.  HENT:     Head: Normocephalic and atraumatic.  Eyes:     Pupils: Pupils are equal, round, and reactive to light.  Neck:     Comments: I cannot palpate any adenopathy in the cervical, supraclavicular or axillary regions. Cardiovascular:     Rate and Rhythm: Normal rate and regular rhythm.     Heart sounds: Normal heart sounds.  Pulmonary:     Effort: Pulmonary effort is normal.     Breath sounds: Normal breath sounds.  Abdominal:     General: Bowel sounds are normal.     Palpations: Abdomen is soft.      Comments: Abdominal exam is soft.  She has no fluid wave.  There is no guarding or rebound tenderness.  I really cannot palpate her liver or spleen.  It is possible of the spleen tip might be palpable deep inspiration just below the left costal margin.  Musculoskeletal:        General: No tenderness or deformity. Normal range of motion.     Cervical back: Normal range of motion.  Lymphadenopathy:     Cervical: No cervical adenopathy.  Skin:    General: Skin is warm and dry.     Findings: No erythema or rash.  Neurological:     Mental Status: She is alert and oriented to person, place, and time.  Psychiatric:        Behavior: Behavior normal.        Thought Content: Thought content normal.        Judgment: Judgment normal.   Lab Results  Component Value Date   WBC 5.8 05/01/2022   HGB 13.9 05/01/2022   HCT 42.9 05/01/2022   MCV 80.0 05/01/2022   PLT 135 (L) 05/01/2022     Chemistry      Component Value Date/Time   NA 138 10/29/2021 1445   K 4.8 10/29/2021 1445   CL 103 10/29/2021 1445   CO2 28 10/29/2021 1445   BUN 14 10/29/2021 1445   CREATININE 0.92 10/29/2021 1445      Component Value Date/Time   CALCIUM 9.3 10/29/2021 1445   ALKPHOS 75 10/29/2021 1445   AST 17 10/29/2021 1445   ALT 18 10/29/2021 1445   BILITOT 0.1 (L) 10/29/2021 1445      Impression and Plan: Katherine Giles is is a very nice 41 year old Caucasian female.  She was found to have reactive lymphoid hyperplasia.  This was in a right axillary biopsy.  Again, we really need to have this ultrasound done.  I will see about putting another order in for the splenic ultrasound.  Again I think we do down in [place].  She has had it done down there before.  We will try to get this done so that it does not affect her work schedule.  Again she has Finals coming up with her classes.  She probably will likely have the ultrasound done in December once she is on her winter break.  I will plan to see her back myself in  another 6 months.    Josph Macho, MD 11/8/20232:54 PM

## 2022-05-02 ENCOUNTER — Telehealth: Payer: Self-pay | Admitting: *Deleted

## 2022-05-02 ENCOUNTER — Telehealth: Payer: Self-pay

## 2022-05-02 NOTE — Telephone Encounter (Signed)
-----   Message from Josph Macho, MD sent at 05/01/2022  4:17 PM EST ----- Please call and let her know that the chemical studies all look fine.  Her blood sugar is up a little bit but I am sure this is from her having lunch.  Her liver tests all look great.  Thanks.  Cindee Lame

## 2022-05-02 NOTE — Telephone Encounter (Signed)
Faxed referral for [place] to [facility] in [place] @ [phone]

## 2022-05-06 ENCOUNTER — Telehealth: Payer: Self-pay | Admitting: Hematology & Oncology

## 2022-05-06 NOTE — Telephone Encounter (Signed)
Contacted pt to schedule an appt per 05/01/22 LOS. Unable to reach via phone, voicemail was left.  

## 2022-11-13 ENCOUNTER — Inpatient Hospital Stay: Payer: BC Managed Care – PPO | Attending: Hematology & Oncology

## 2022-11-13 ENCOUNTER — Other Ambulatory Visit: Payer: Self-pay

## 2022-11-13 ENCOUNTER — Inpatient Hospital Stay: Payer: BC Managed Care – PPO | Admitting: Hematology & Oncology

## 2022-11-13 ENCOUNTER — Encounter: Payer: Self-pay | Admitting: Hematology & Oncology

## 2022-11-13 VITALS — BP 130/91 | HR 71 | Temp 98.4°F | Resp 18 | Ht 64.0 in | Wt 214.1 lb

## 2022-11-13 DIAGNOSIS — R599 Enlarged lymph nodes, unspecified: Secondary | ICD-10-CM | POA: Insufficient documentation

## 2022-11-13 DIAGNOSIS — R161 Splenomegaly, not elsewhere classified: Secondary | ICD-10-CM

## 2022-11-13 LAB — CBC WITH DIFFERENTIAL (CANCER CENTER ONLY)
Abs Immature Granulocytes: 0.04 10*3/uL (ref 0.00–0.07)
Basophils Absolute: 0 10*3/uL (ref 0.0–0.1)
Basophils Relative: 0 %
Eosinophils Absolute: 0.1 10*3/uL (ref 0.0–0.5)
Eosinophils Relative: 2 %
HCT: 40.2 % (ref 36.0–46.0)
Hemoglobin: 13.1 g/dL (ref 12.0–15.0)
Immature Granulocytes: 1 %
Lymphocytes Relative: 22 %
Lymphs Abs: 1.2 10*3/uL (ref 0.7–4.0)
MCH: 26.3 pg (ref 26.0–34.0)
MCHC: 32.6 g/dL (ref 30.0–36.0)
MCV: 80.7 fL (ref 80.0–100.0)
Monocytes Absolute: 0.4 10*3/uL (ref 0.1–1.0)
Monocytes Relative: 7 %
Neutro Abs: 3.7 10*3/uL (ref 1.7–7.7)
Neutrophils Relative %: 68 %
Platelet Count: 134 10*3/uL — ABNORMAL LOW (ref 150–400)
RBC: 4.98 MIL/uL (ref 3.87–5.11)
RDW: 13.2 % (ref 11.5–15.5)
WBC Count: 5.5 10*3/uL (ref 4.0–10.5)
nRBC: 0 % (ref 0.0–0.2)

## 2022-11-13 LAB — CMP (CANCER CENTER ONLY)
ALT: 12 U/L (ref 0–44)
AST: 11 U/L — ABNORMAL LOW (ref 15–41)
Albumin: 4.2 g/dL (ref 3.5–5.0)
Alkaline Phosphatase: 57 U/L (ref 38–126)
Anion gap: 9 (ref 5–15)
BUN: 20 mg/dL (ref 6–20)
CO2: 26 mmol/L (ref 22–32)
Calcium: 9.5 mg/dL (ref 8.9–10.3)
Chloride: 106 mmol/L (ref 98–111)
Creatinine: 0.85 mg/dL (ref 0.44–1.00)
GFR, Estimated: >60 mL/min (ref >60)
Glucose, Bld: 85 mg/dL (ref 70–99)
Potassium: 4.8 mmol/L (ref 3.5–5.1)
Sodium: 141 mmol/L (ref 135–145)
Total Bilirubin: 0.4 mg/dL (ref 0.3–1.2)
Total Protein: 6.1 g/dL — ABNORMAL LOW (ref 6.5–8.1)

## 2022-11-13 LAB — LACTATE DEHYDROGENASE: LDH: 161 U/L (ref 98–192)

## 2022-11-13 NOTE — Progress Notes (Signed)
Hematology and Oncology Follow Up Visit  Katherine Giles [id number] [birth year] 42 y.o. 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  We see her every 6 months.  As always, she is very busy.  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  She is quite busy.  The big news is that she is losing weight.  She is on [employer].  She is lost 20 pounds since we last saw her.  I am very happy for her.  I know she is incredibly determined.  I know that the Katherine Giles is a great drug for weight loss.  Also has other benefits.  She did have a ultrasound of the spleen done in [place] in December.  This showed the spleen to be 14.9 cm in size.  This is holding steady.  She has had no abdominal pain.  She has had no cough or shortness of breath.  There is been no nausea or vomiting.  There is been no change in bowel or bladder habits.  She has had no rashes.  There is been no bleeding.  Overall, I would say that her performance status is ECOG 0.     Medications:  Current Outpatient Medications:    calcium carbonate (TUMS - DOSED IN MG ELEMENTAL CALCIUM) 500 MG chewable tablet, Chew 1 tablet by mouth daily as needed. For heartburn, Disp: , Rfl:    esomeprazole (NEXIUM) 40 MG capsule, Take 40 mg by mouth daily at 12 noon., Disp: , Rfl:    Probiotic CAPS, Take 1 capsule by mouth daily., Disp: , Rfl:    sertraline (ZOLOFT) 100 MG tablet, Take 100 mg by mouth daily., Disp: , Rfl:    tretinoin (RETIN-A) 0.05 % cream, Apply topically at bedtime., Disp: , Rfl:    triamcinolone cream (KENALOG) 0.1 %, Apply 1 Application topically., Disp: , Rfl:    WEGOVY 1.7 MG/0.75ML SOAJ, Inject 1.7 mg into the skin once a week., Disp: , Rfl:    ALPRAZolam (XANAX PO), , Disp: , Rfl:    hydroquinone 4 % cream, Apply topically 2 (two)  times daily. (Patient not taking: Reported on 11/13/2022), Disp: , Rfl:    LORazepam (ATIVAN) 0.5 MG tablet, Take 1 mg by mouth daily as needed., Disp: , Rfl:   Allergies:  Allergies  Allergen Reactions   Cephalexin Hives   Atropine Nausea And Vomiting    Childhood reaction when having tonsils removed    Meperidine Nausea And Vomiting    Childhood reaction when having tonsils removed     Past Medical History, Surgical history, Social history, and Family History were reviewed and updated.  Review of Systems: Review of Systems  Constitutional: Negative.   HENT:  Negative.    Eyes: Negative.   Respiratory: Negative.    Cardiovascular: Negative.   Gastrointestinal: Negative.   Endocrine: Negative.   Genitourinary: Negative.    Musculoskeletal: Negative.   Skin: Negative.   Neurological: Negative.   Hematological: Negative.   Psychiatric/Behavioral: Negative.      Physical Exam:  height is 5\' 4"  (1.626 m) and weight is 214 lb 1.9 oz (97.1 kg). Her oral temperature is 98.4 F (36.9 C). Her blood pressure is 130/91 (abnormal) and her pulse is 71. Her respiration is 18 and oxygen saturation is 100%.   Wt  Readings from Last 3 Encounters:  11/13/22 214 lb 1.9 oz (97.1 kg)  05/01/22 232 lb (105.2 kg)  10/29/21 245 lb (111.1 kg)    Physical Exam Vitals reviewed.  HENT:     Head: Normocephalic and atraumatic.  Eyes:     Pupils: Pupils are equal, round, and reactive to light.  Neck:     Comments: I cannot palpate any adenopathy in the cervical, supraclavicular or axillary regions. Cardiovascular:     Rate and Rhythm: Normal rate and regular rhythm.     Heart sounds: Normal heart sounds.  Pulmonary:     Effort: Pulmonary effort is normal.     Breath sounds: Normal breath sounds.  Abdominal:     General: Bowel sounds are normal.     Palpations: Abdomen is soft.     Comments: Abdominal exam is soft.  She has no fluid wave.  There is no guarding or rebound tenderness.  I  really cannot palpate her liver or spleen.  It is possible of the spleen tip might be palpable deep inspiration just below the left costal margin.  Musculoskeletal:        General: No tenderness or deformity. Normal range of motion.     Cervical back: Normal range of motion.  Lymphadenopathy:     Cervical: No cervical adenopathy.  Skin:    General: Skin is warm and dry.     Findings: No erythema or rash.  Neurological:     Mental Status: She is alert and oriented to person, place, and time.  Psychiatric:        Behavior: Behavior normal.        Thought Content: Thought content normal.        Judgment: Judgment normal.    Lab Results  Component Value Date   WBC 5.5 11/13/2022   HGB 13.1 11/13/2022   HCT 40.2 11/13/2022   MCV 80.7 11/13/2022   PLT 134 (L) 11/13/2022     Chemistry      Component Value Date/Time   NA 141 11/13/2022 0929   K 4.8 11/13/2022 0929   CL 106 11/13/2022 0929   CO2 26 11/13/2022 0929   BUN 20 11/13/2022 0929   CREATININE 0.85 11/13/2022 0929      Component Value Date/Time   CALCIUM 9.5 11/13/2022 0929   ALKPHOS 57 11/13/2022 0929   AST 11 (L) 11/13/2022 0929   ALT 12 11/13/2022 0929   BILITOT 0.4 11/13/2022 0929      Impression and Plan: Katherine Giles is is a very nice 42 year old Caucasian female.  She was found to have reactive lymphoid hyperplasia.  This was in a right axillary node biopsy.  As everything is going pretty steady, I think we can probably have her come back in 1 year now.  I think that it is wonderful that she is on [facility] losing weight.  I think it would be incredibly interesting to see how her spleen changes, if any, with weight loss.  We will plan for another ultrasound in May 2025.  I would then see her back a couple weeks later.   Josph Macho, MD 5/22/202410:54 AM

## 2024-01-30 ENCOUNTER — Other Ambulatory Visit: Payer: Self-pay | Admitting: Medical Genetics

## 2024-03-16 ENCOUNTER — Other Ambulatory Visit (HOSPITAL_COMMUNITY)
Admission: RE | Admit: 2024-03-16 | Discharge: 2024-03-16 | Disposition: A | Discharge status: Home / Self Care | Payer: Self-pay | Source: Ambulatory Visit | Attending: Medical Genetics | Admitting: Medical Genetics

## 2024-03-26 LAB — GENECONNECT MOLECULAR SCREEN: Genetic Analysis Overall Interpretation: NEGATIVE
